# Patient Record
Sex: Male | Born: 1980 | Hispanic: No | Smoking: Never smoker
Health system: Southern US, Community
[De-identification: ages and names within clinical notes are randomized; demographics above are authoritative.]

## PROBLEM LIST (undated history)

## (undated) DIAGNOSIS — E7800 Pure hypercholesterolemia, unspecified: Secondary | ICD-10-CM

## (undated) DIAGNOSIS — I10 Essential (primary) hypertension: Secondary | ICD-10-CM

---

## 2002-03-14 ENCOUNTER — Encounter: Payer: Self-pay | Admitting: Emergency Medicine

## 2002-03-14 ENCOUNTER — Emergency Department (HOSPITAL_COMMUNITY): Admission: EM | Admit: 2002-03-14 | Discharge: 2002-03-14 | Payer: Self-pay | Admitting: Emergency Medicine

## 2002-03-16 ENCOUNTER — Ambulatory Visit (HOSPITAL_COMMUNITY): Admission: RE | Admit: 2002-03-16 | Discharge: 2002-03-16 | Payer: Self-pay | Admitting: Otolaryngology

## 2002-03-16 ENCOUNTER — Encounter: Payer: Self-pay | Admitting: Otolaryngology

## 2004-10-10 ENCOUNTER — Emergency Department (HOSPITAL_COMMUNITY): Admission: EM | Admit: 2004-10-10 | Discharge: 2004-10-10 | Payer: Self-pay | Admitting: Family Medicine

## 2012-05-05 ENCOUNTER — Emergency Department (INDEPENDENT_AMBULATORY_CARE_PROVIDER_SITE_OTHER)
Admission: EM | Admit: 2012-05-05 | Discharge: 2012-05-05 | Disposition: A | Discharge status: Home / Self Care | Payer: Self-pay | Source: Home / Self Care

## 2012-05-05 ENCOUNTER — Encounter (HOSPITAL_COMMUNITY): Payer: Self-pay | Admitting: Emergency Medicine

## 2012-05-05 DIAGNOSIS — K625 Hemorrhage of anus and rectum: Secondary | ICD-10-CM

## 2012-05-05 DIAGNOSIS — K648 Other hemorrhoids: Secondary | ICD-10-CM

## 2012-05-05 LAB — OCCULT BLOOD, POC DEVICE: Fecal Occult Bld: NEGATIVE

## 2012-05-05 MED ORDER — HYDROCORTISONE ACETATE 25 MG RE SUPP: 25.0000 mg | Freq: Two times a day (BID) | RECTAL | Status: DC

## 2012-05-05 NOTE — ED Provider Notes (Signed)
History     CSN: [id number]  Arrival date & time 05/05/12  1023   None     Chief Complaint  Patient presents with  . Hemorrhoids    (Consider location/radiation/quality/duration/timing/severity/associated sxs/prior treatment) HPI Comments: Hispanic male presents with rectal bleeding. He said bleeding, associated with having stools for 5 months. In the past 3, months. He has felt a lump in his anus. He denies any pain. The blood is scanty and primarily seen on wiping. There has been no dripping of blood or bleeding between bowel movements. Denies systemic symptoms such as unusual weakness, fever, chills, or malaise.   History reviewed. No pertinent past medical history.  History reviewed. No pertinent past surgical history.  No family history on file.  History  Substance Use Topics  . Smoking status: Not on file  . Smokeless tobacco: Not on file  . Alcohol Use: Yes      Review of Systems  Constitutional: Negative.  Negative for fever and chills.  Respiratory: Negative.   Gastrointestinal: Positive for blood in stool and anal bleeding. Negative for nausea, vomiting, abdominal pain, diarrhea, constipation and rectal pain.  Genitourinary: Negative.   Skin: Negative.   Neurological: Negative.     Allergies  Review of patient's allergies indicates no known allergies.  Home Medications   Current Outpatient Rx  Name Route Sig Dispense Refill  . HYDROCORTISONE ACETATE 25 MG RE SUPP Rectal Place 1 suppository (25 mg total) rectally 2 (two) times daily. 28 suppository 0    BP 120/77  Pulse 71  Temp 98.2 F (36.8 C) (Oral)  Resp 16  SpO2 100%  Physical Exam  Constitutional: He is oriented to person, place, and time. He appears well-developed and well-nourished. No distress.  Eyes: EOM are normal.  Neck: Normal range of motion. Neck supple.  Pulmonary/Chest: Effort normal. No respiratory distress.  Abdominal: Soft. There is no tenderness.  Genitourinary: Guaiac  negative stool.       Rectal exam: There are several noninflamed hemorrhoidal tags external to the rectum. On digital palpation. There is tenderness in the mucosa just within the rectal sphincter. No specific nodule for mass is palpated. No overt blood was seen in Hemoccult is negative  Musculoskeletal: He exhibits no edema and no tenderness.  Neurological: He is alert and oriented to person, place, and time. No cranial nerve deficit.  Skin: Skin is warm and dry.  Psychiatric: He has a normal mood and affect.    ED Course  Procedures (including critical care time)  Labs Reviewed - No data to display No results found.   1. Rectal bleeding   2. Hemorrhoids, internal, with bleeding       MDM  Although there is tenderness just beyond the anal sphincter, there are no other objective findings today. No palpable nodules or other masses and no overt or occult bleeding. Treat with Anusol-HC suppositories for 7-10 days. If the bleeding continues he should followup with the lower GI physician. Or if unable to obtain an appointment. May return. 4 increase amount of bleeding or the development of pain He should go to the emergency department         Hayden Rasmussen, NP 05/05/12 1245

## 2012-05-05 NOTE — ED Notes (Signed)
Pt c/o poss hemorrhoids x2 months... 5 months ago noticed blood in stool and on toilet paper when he wiped... 2 months ago noticed a lump near anus... Sx include: blood in stool... Denies: pain, fevers, vomiting, diarrhea, nausea, mucous in stools.. Pt is alert w/no signs of distress... Also c/o lower back pain.

## 2012-05-05 NOTE — ED Provider Notes (Signed)
Medical screening examination/treatment/procedure(s) were performed by non-physician practitioner and as supervising physician I was immediately available for consultation/collaboration.  Raynald Blend, MD 05/05/12 [phone]

## 2013-01-15 ENCOUNTER — Encounter (HOSPITAL_COMMUNITY): Payer: Self-pay | Admitting: Emergency Medicine

## 2013-01-15 ENCOUNTER — Emergency Department (INDEPENDENT_AMBULATORY_CARE_PROVIDER_SITE_OTHER)
Admission: EM | Admit: 2013-01-15 | Discharge: 2013-01-15 | Disposition: A | Discharge status: Home / Self Care | Payer: Self-pay | Source: Home / Self Care

## 2013-01-15 DIAGNOSIS — H15002 Unspecified scleritis, left eye: Secondary | ICD-10-CM

## 2013-01-15 DIAGNOSIS — H15009 Unspecified scleritis, unspecified eye: Secondary | ICD-10-CM

## 2013-01-15 LAB — URINALYSIS, ROUTINE W REFLEX MICROSCOPIC
Glucose, UA: NEGATIVE mg/dL
Leukocytes, UA: NEGATIVE
Specific Gravity, Urine: 1.02 (ref 1.005–1.030)
pH: 5.5 (ref 5.0–8.0)

## 2013-01-15 LAB — BASIC METABOLIC PANEL
CO2: 25 mEq/L (ref 19–32)
Calcium: 9.5 mg/dL (ref 8.4–10.5)
Chloride: 100 mEq/L (ref 96–112)
Potassium: 3.5 mEq/L (ref 3.5–5.1)
Sodium: 136 mEq/L (ref 135–145)

## 2013-01-15 LAB — CBC
HCT: 42.6 % (ref 39.0–52.0)
Hemoglobin: 14.9 g/dL (ref 13.0–17.0)
WBC: 6.8 10*3/uL (ref 4.0–10.5)

## 2013-01-15 MED ORDER — ARTIFICIAL TEARS OP OINT: TOPICAL_OINTMENT | OPHTHALMIC | Status: DC | PRN

## 2013-01-15 MED ORDER — TETRACAINE HCL 0.5 % OP SOLN
OPHTHALMIC | Status: AC
  Filled 2013-01-15: qty 2

## 2013-01-15 MED ORDER — INDOMETHACIN 25 MG PO CAPS: 25.0000 mg | ORAL_CAPSULE | Freq: Three times a day (TID) | ORAL | Status: DC

## 2013-01-15 NOTE — ED Provider Notes (Signed)
Brandon Irwin is a 32 y.o. male who presents to Urgent Care today for left eye pain. Patient developed left eye redness on Wednesday with no injury or incident. He has pain and redness on the medial aspect of his eye and pain with lateral eye deviation. He denies any fevers or chills cough change in his urine. Additionally he notes a mild left-sided headache. He denies any blurry vision with eye motion. He denies any photophobia. He has never had anything like this before and feels well otherwise. He does not have health insurance nor access to an ophthalmologist. No night sweats. No exposure to welding or metal fragment.    PMH reviewed.  Healthy otherwise  History  Substance Use Topics  . Smoking status: Not on file  . Smokeless tobacco: Not on file  . Alcohol Use: Yes   works as a Administrator ROS as above Medications reviewed. No current facility-administered medications for this encounter.   Current Outpatient Prescriptions  Medication Sig Dispense Refill  . artificial tears (LACRILUBE) OINT ophthalmic ointment Apply to eye every 4 (four) hours as needed.  3.5 g  1  . hydrocortisone (ANUSOL-HC) 25 MG suppository Place 1 suppository (25 mg total) rectally 2 (two) times daily.  28 suppository  0  . indomethacin (INDOCIN) 25 MG capsule Take 1 capsule (25 mg total) by mouth 3 (three) times daily with meals.  75 capsule  0    Exam:  BP 117/69  Pulse 63  Temp(Src) 98.7 F (37.1 C) (Oral)  SpO2 99% Gen: Well NAD HEENT: EOMI,  MMM, nodular conjunctival injection on the medial aspect of the sclera extending to the but not through or over the cornea.  Uptake of fluorescein in the area of nodular conjunctival injection.  Normal funduscopic exam bilaterally.  Pupils are equal round and reactive to light.  Visual acuity is 20/30 bilaterally.   Lungs: CTABL Nl WOB Heart: RRR no MRG Abd: NABS, NT, ND Exts: Non edematous BL  LE, warm and well perfused.   No results found for this or any  previous visit (from the past 24 hour(s)). No results found.  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  Additional possibilities include irritant/or abrasion.  Plan: Ideally this is managed with ophthalmologist over patient is unlikely to be able to be seen. Provided information for Dr. Jilda Roche office.  Plan to treat with oral indomethacin 75 mg daily.  Additionally use topical Lacri-Lube.  Discussed in detail precautions. We'll followup in the emergency room if not improving or worsening.  Additionally will do workup for systemic illness with CBC, BMP, ESR, urinalysis with reflex microscopy.  Will call patient if labs are positive.  Patient expresses understanding and agreement. Please see discharge summary.      Rodolph Bong, MD 01/15/13 [phone]

## 2013-01-15 NOTE — ED Provider Notes (Signed)
Medical screening examination/treatment/procedure(s) were performed by resident physician or non-physician practitioner and as supervising physician I was immediately available for consultation/collaboration.   Jennelle Pinkstaff DOUGLAS MD.   Luismanuel Corman D Genesee Nase, MD 01/15/13 1344 

## 2013-01-15 NOTE — ED Notes (Signed)
Complaining of left eye pain and redness which started last Wednesday.  Patient has used eye drops Visine but no relief.  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  Does have blurry vision as he tries to see from the left side.

## 2013-03-07 ENCOUNTER — Encounter (HOSPITAL_COMMUNITY): Payer: Self-pay | Admitting: Emergency Medicine

## 2013-03-07 ENCOUNTER — Emergency Department (INDEPENDENT_AMBULATORY_CARE_PROVIDER_SITE_OTHER)
Admission: EM | Admit: 2013-03-07 | Discharge: 2013-03-07 | Disposition: A | Discharge status: Home / Self Care | Payer: Self-pay | Source: Home / Self Care | Attending: Family Medicine | Admitting: Family Medicine

## 2013-03-07 DIAGNOSIS — T63391A Toxic effect of venom of other spider, accidental (unintentional), initial encounter: Secondary | ICD-10-CM

## 2013-03-07 DIAGNOSIS — T6391XA Toxic effect of contact with unspecified venomous animal, accidental (unintentional), initial encounter: Secondary | ICD-10-CM

## 2013-03-07 DIAGNOSIS — Z23 Encounter for immunization: Secondary | ICD-10-CM

## 2013-03-07 MED ORDER — LORAZEPAM 0.5 MG PO TABS: ORAL_TABLET | ORAL | Status: DC

## 2013-03-07 MED ORDER — IBUPROFEN 800 MG PO TABS: ORAL_TABLET | ORAL | Status: DC

## 2013-03-07 MED ORDER — HYDROCODONE-ACETAMINOPHEN 5-325 MG PO TABS: ORAL_TABLET | ORAL | Status: DC

## 2013-03-07 MED ORDER — TETANUS-DIPHTH-ACELL PERTUSSIS 5-2.5-18.5 LF-MCG/0.5 IM SUSP
0.5000 mL | Freq: Once | INTRAMUSCULAR | Status: AC
  Administered 2013-03-07: 0.5 mL via INTRAMUSCULAR

## 2013-03-07 MED ORDER — TETANUS-DIPHTH-ACELL PERTUSSIS 5-2.5-18.5 LF-MCG/0.5 IM SUSP
INTRAMUSCULAR | Status: AC
  Filled 2013-03-07: qty 0.5

## 2013-03-07 NOTE — ED Provider Notes (Signed)
CSN: [id number]     Arrival date & time 03/07/13  4098 History   First MD Initiated Contact with Patient 03/07/13 0831     Chief Complaint  Patient presents with  . Insect Bite   (Consider location/radiation/quality/duration/timing/severity/associated sxs/prior Treatment) HPI Comments: 32 year old man otherwise previously healthy. Here complaining of right hand pain with radiation towards the right forearm and arm also reporting abdominal wall pain on right side and headache. Patient reports symptoms started after he was bitten by a spider while removing a motor piece from a cluttered garage. Patient was not able to recover the spider but states that that black widow spiders have been recovered from the same place recently and described the spider as black with a red dot in lower body. Denies chest pain, palpitations, sweating or difficulty breathing. No nausea or vomiting. No blurred vision. Reports mostly muscular pain is bothering him currently.   History reviewed. No pertinent past medical history. History reviewed. No pertinent past surgical history. History reviewed. No pertinent family history. History  Substance Use Topics  . Smoking status: Not on file  . Smokeless tobacco: Not on file  . Alcohol Use: Yes    Review of Systems  Constitutional: Negative for fever, chills, diaphoresis, appetite change and fatigue.  HENT: Negative for congestion, sore throat and trouble swallowing.   Eyes: Negative for visual disturbance.  Respiratory: Negative for cough, shortness of breath and wheezing.   Gastrointestinal: Positive for abdominal pain. Negative for nausea, vomiting and diarrhea.  Musculoskeletal: Positive for myalgias. Negative for arthralgias.  Skin: Positive for rash and wound.  Neurological: Positive for headaches. Negative for dizziness.  All other systems reviewed and are negative.    Allergies  Review of patient's allergies indicates no known allergies.  Home  Medications   Current Outpatient Rx  Name  Route  Sig  Dispense  Refill  . artificial tears (LACRILUBE) OINT ophthalmic ointment   Ophthalmic   Apply to eye every 4 (four) hours as needed.   3.5 g   1   . HYDROcodone-acetaminophen (NORCO/VICODIN) 5-325 MG per tablet      1-2 tabs po every 6 hours as needed for pain.   20 tablet   0   . ibuprofen (ADVIL,MOTRIN) 800 MG tablet      Take with food   21 tablet   0   . LORazepam (ATIVAN) 0.5 MG tablet      2 tabs by mouth every 8 hours as needed for muscle spasm.   12 tablet   0    BP 131/83  Pulse 66  Temp(Src) 97.3 F (36.3 C) (Oral)  Resp 18  SpO2 100% Physical Exam  Nursing note and vitals reviewed. Constitutional: He is oriented to person, place, and time. He appears well-developed and well-nourished. No distress.  HENT:  Head: Normocephalic and atraumatic.  Mouth/Throat: Oropharynx is clear and moist. No oropharyngeal exudate.  Eyes: Conjunctivae and EOM are normal. Pupils are equal, round, and reactive to light. No scleral icterus.  Neck: Neck supple.  Cardiovascular: Normal rate, regular rhythm, normal heart sounds and intact distal pulses.   No murmur heard. Pulmonary/Chest: Effort normal and breath sounds normal. No respiratory distress. He has no wheezes. He has no rales. He exhibits no tenderness.  Abdominal: Soft. Bowel sounds are normal. He exhibits no distension and no mass. There is no tenderness. There is no rebound and no guarding.  Musculoskeletal:  There is reported muscle tenderness in dorsolateral forearm radiating from 4th digit. No  skin changes in color or altered range of motion.  Lymphadenopathy:    He has no cervical adenopathy.  Neurological: He is alert and oriented to person, place, and time.  Skin: He is not diaphoretic.  There is swelling and erythema of the right 4th digit proximal phalange.  No obvious puncture wound. There is a small wheel which resembles a possible insect bite. No  stricking erythema.    ED Course  Procedures (including critical care time) Labs Review Labs Reviewed - No data to display Imaging Review No results found.  MDM   1. 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 No severe systemic symptoms currently. Mild local reaction. Tetatanus buster administered. Prescribed ibuprofen 800 mg, hydrocodone/acetaminophen 325 mg or 5 mg #20 tablets and Ativan 0.5 mg 12 tablets. Supportive care and red flags that should prompt his return to medical attention discussed with patient in Spanish and provided in writing.     Sharin Grave, MD 03/08/13 [phone]

## 2013-03-07 NOTE — ED Notes (Signed)
C/o insect bite this morning.   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  Patient says he has pain going up his right arm.

## 2013-12-31 ENCOUNTER — Emergency Department (INDEPENDENT_AMBULATORY_CARE_PROVIDER_SITE_OTHER)
Admission: EM | Admit: 2013-12-31 | Discharge: 2013-12-31 | Disposition: A | Discharge status: Home / Self Care | Payer: Self-pay | Source: Home / Self Care | Attending: Family Medicine | Admitting: Family Medicine

## 2013-12-31 ENCOUNTER — Encounter (HOSPITAL_COMMUNITY): Payer: Self-pay | Admitting: Emergency Medicine

## 2013-12-31 DIAGNOSIS — K2921 Alcoholic gastritis with bleeding: Secondary | ICD-10-CM

## 2013-12-31 MED ORDER — GI COCKTAIL ~~LOC~~
ORAL | Status: AC
  Filled 2013-12-31: qty 30

## 2013-12-31 MED ORDER — ONDANSETRON HCL 4 MG PO TABS: 4.0000 mg | ORAL_TABLET | Freq: Four times a day (QID) | ORAL | Status: DC

## 2013-12-31 MED ORDER — ONDANSETRON 4 MG PO TBDP
4.0000 mg | ORAL_TABLET | Freq: Once | ORAL | Status: AC
  Administered 2013-12-31: 4 mg via ORAL

## 2013-12-31 MED ORDER — GI COCKTAIL ~~LOC~~
30.0000 mL | Freq: Once | ORAL | Status: AC
  Administered 2013-12-31: 30 mL via ORAL

## 2013-12-31 MED ORDER — ONDANSETRON 4 MG PO TBDP
ORAL_TABLET | ORAL | Status: AC
  Filled 2013-12-31: qty 1

## 2013-12-31 NOTE — ED Provider Notes (Signed)
CSN: [id number]     Arrival date & time 12/31/13  1352 History   First MD Initiated Contact with Patient 12/31/13 1551     Chief Complaint  Patient presents with  . Emesis   (Consider location/radiation/quality/duration/timing/severity/associated sxs/prior Treatment) Patient is a 33 y.o. male presenting with vomiting. The history is provided by the patient.  Emesis Severity:  Mild Duration:  8 hours Quality:  Bright red blood Able to tolerate:  Liquids Progression:  Resolved Chronicity:  New (xs beer ingestion over w/e at family party, , this am had n/v x 4 with streaks of blood seen x 2) Relieved by:  Nothing Worsened by:  Nothing tried Ineffective treatments:  None tried Associated symptoms: no abdominal pain and no diarrhea   Risk factors: alcohol use     History reviewed. No pertinent past medical history. History reviewed. No pertinent past surgical history. History reviewed. No pertinent family history. History  Substance Use Topics  . Smoking status: Unknown If Ever Smoked  . Smokeless tobacco: Not on file  . Alcohol Use: Yes    Review of Systems  Constitutional: Negative.   Cardiovascular: Negative.   Gastrointestinal: Positive for vomiting. Negative for nausea, abdominal pain, diarrhea and blood in stool.    Allergies  Review of patient's allergies indicates no known allergies.  Home Medications   Prior to Admission medications   Medication Sig Start Date End Date Taking? Authorizing Provider  artificial tears (LACRILUBE) OINT ophthalmic ointment Apply to eye every 4 (four) hours as needed. 01/15/13   Rodolph BongEvan S Corey, MD  HYDROcodone-acetaminophen (NORCO/VICODIN) 5-325 MG per tablet 1-2 tabs po every 6 hours as needed for pain. 03/07/13   Adlih Moreno-Coll, MD  ibuprofen (ADVIL,MOTRIN) 800 MG tablet Take with food 03/07/13   Adlih Moreno-Coll, MD  LORazepam (ATIVAN) 0.5 MG tablet 2 tabs by mouth every 8 hours as needed for muscle spasm. 03/07/13   Adlih Moreno-Coll, MD   ondansetron (ZOFRAN) 4 MG tablet Take 1 tablet (4 mg total) by mouth every 6 (six) hours. Prn n/v. 12/31/13   Linna HoffJames D Kindl, MD   BP 133/87  Pulse 66  Temp(Src) 99.2 F (37.3 C) (Oral)  Resp 14  SpO2 100% Physical Exam  Nursing note and vitals reviewed. Constitutional: He is oriented to person, place, and time. He appears well-developed and well-nourished. No distress.  HENT:  Mouth/Throat: Oropharynx is clear and moist.  Cardiovascular: Normal heart sounds.   Pulmonary/Chest: Breath sounds normal.  Abdominal: Soft. Bowel sounds are normal. He exhibits no distension and no mass. There is no tenderness. There is no rebound and no guarding.  Neurological: He is alert and oriented to person, place, and time.  Skin: Skin is warm and dry.    ED Course  Procedures (including critical care time) Labs Review Labs Reviewed - No data to display  Imaging Review No results found.   MDM   1. Alcoholic gastritis with bleeding        Linna HoffJames D Kindl, MD 12/31/13 1626

## 2013-12-31 NOTE — ED Notes (Signed)
Reportedly over indulged this weekend

## 2013-12-31 NOTE — Discharge Instructions (Signed)
Use mylanta at bedtime for 2 weeks, no more beer for 2 weeks , return to ER if further bleeding.

## 2014-04-30 ENCOUNTER — Emergency Department (HOSPITAL_COMMUNITY)
Admission: EM | Admit: 2014-04-30 | Discharge: 2014-04-30 | Disposition: A | Discharge status: Home / Self Care | Payer: Self-pay | Attending: Emergency Medicine | Admitting: Emergency Medicine

## 2014-04-30 ENCOUNTER — Encounter (HOSPITAL_COMMUNITY): Payer: Self-pay | Admitting: Emergency Medicine

## 2014-04-30 DIAGNOSIS — B356 Tinea cruris: Secondary | ICD-10-CM | POA: Insufficient documentation

## 2014-04-30 MED ORDER — CLOTRIMAZOLE 1 % EX CREA: 1.0000 "application " | TOPICAL_CREAM | Freq: Two times a day (BID) | CUTANEOUS | Status: DC

## 2014-04-30 NOTE — ED Provider Notes (Signed)
CSN: [id number]     Arrival date & time 04/30/14  1156 History  This chart was scribed for non-physician practitioner, Trixie DredgeEmily Alexzandra Bilton, PA-C working with Flint MelterElliott L Wentz, MD by Greggory StallionKayla Andersen, ED scribe. This patient was seen in room TR05C/TR05C and the patient's care was started at 12:36 PM.    Chief Complaint  Patient presents with  . Rash   The history is provided by the patient. No language interpreter was used.   HPI Comments: Brandon Irwin is a 33 y.o. male who presents to the Emergency Department complaining of an itchy rash to his groin area that started 2 weeks ago. The itching is only on the left side, in one specific area.  Denies any pain with the rash. He also has changes to his toenails.  States he has not used any OTC medications for the rash. Pt uses condoms and states there is no concern for STD. Denies fever, abdominal pain, dysuria, testicular pain or swelling, weakness or numbness in feet.  Denies genital lesions.   History reviewed. No pertinent past medical history. History reviewed. No pertinent past surgical history. History reviewed. No pertinent family history. History  Substance Use Topics  . Smoking status: Unknown If Ever Smoked  . Smokeless tobacco: Not on file  . Alcohol Use: Yes    Review of Systems  Constitutional: Negative for fever.  Gastrointestinal: Negative for abdominal pain.  Genitourinary: Negative for dysuria, scrotal swelling and testicular pain.  Skin: Positive for rash.  Neurological: Negative for weakness and numbness.  All other systems reviewed and are negative.  Allergies  Review of patient's allergies indicates no known allergies.  Home Medications   Prior to Admission medications   Medication Sig Start Date End Date Taking? Authorizing Provider  artificial tears (LACRILUBE) OINT ophthalmic ointment Apply to eye every 4 (four) hours as needed. 01/15/13   Rodolph BongEvan S Corey, MD  HYDROcodone-acetaminophen (NORCO/VICODIN) 5-325 MG per tablet  1-2 tabs po every 6 hours as needed for pain. 03/07/13   Adlih Moreno-Coll, MD  ibuprofen (ADVIL,MOTRIN) 800 MG tablet Take with food 03/07/13   Adlih Moreno-Coll, MD  LORazepam (ATIVAN) 0.5 MG tablet 2 tabs by mouth every 8 hours as needed for muscle spasm. 03/07/13   Adlih Moreno-Coll, MD  ondansetron (ZOFRAN) 4 MG tablet Take 1 tablet (4 mg total) by mouth every 6 (six) hours. Prn n/v. 12/31/13   Linna HoffJames D Kindl, MD   BP 127/80  Pulse 63  Temp(Src) 98.1 F (36.7 C) (Oral)  Resp 18  SpO2 100%  Physical Exam  Nursing note and vitals reviewed. Constitutional: He appears well-developed and well-nourished. No distress.  HENT:  Head: Normocephalic and atraumatic.  Neck: Neck supple.  Pulmonary/Chest: Effort normal.  Abdominal: Soft. He exhibits no distension. There is no tenderness. There is no rebound and no guarding.  Genitourinary:  White, scaly rash on the left groin in area of heavy pubic hair. No burrows. No lesions with exception of small opening of the skin, likely from scratching.  No erythema, edema, warmth, discharge, or tenderness . No lymphadenopathy. Testicles normal. No discharge. Penis otherwise normal. Not circumcised.   Neurological: He is alert.  Skin: He is not diaphoretic.  Thickening and cracking of nails.     ED Course  Procedures (including critical care time)  DIAGNOSTIC STUDIES: Oxygen Saturation is 100% on RA, normal by my interpretation.    COORDINATION OF CARE: 12:39 PM-Discussed treatment plan which includes tinea treatment with pt at bedside and pt agreed to  plan.   Labs Review Labs Reviewed - No data to display  Imaging Review No results found.   EKG Interpretation None      MDM   Final diagnoses:  Tinea cruris   Afebrile, nontoxic patient with left groin pruritic rash.  Visible rash is very small area, scaly white, likely tinea.  No burrows or insects visualized.  Small lesion is likely from scratching.  No e/o superinfection.  Pt declines STD  testing.   D/C home with clotrimazole, PCP follow up.  Discussed result, findings, treatment, and follow up  with patient.  Pt given return precautions.  Pt verbalizes understanding and agrees with plan.       I personally performed the services described in this documentation, which was scribed in my presence. The recorded information has been reviewed and is accurate.  Trixie Dredgemily Aline Wesche, PA-C 04/30/14 1328

## 2014-04-30 NOTE — ED Notes (Signed)
Pt reports rash and itching to groin area for days. No acute distress noted at triage.

## 2014-04-30 NOTE — ED Notes (Signed)
Pt states he is afraid that he has caused a fungal infection on shaft/head of penis due to washing his feet that has fungal infection in toenail and then washing his genitalia.

## 2014-04-30 NOTE — Discharge Instructions (Signed)
Read the information below.  Use the prescribed medication as directed.  Please discuss all new medications with your pharmacist.  You may return to the Emergency Department at any time for worsening condition or any new symptoms that concern you.  If you develop redness, swelling, pus draining from the wound, or fevers greater than 100.4, return to the ER immediately for a recheck.    Jock Itch Jock itch is a germ infection of the groin and upper thighs. The type of germ that causes jock itch is a fungus. It is itchy and often feels like it is burning. It is common in people who play sports. Sweating and wearing certain athletic gear can cause this type of rash. HOME CARE  Take your medicines as told. Finish them even if you start to feel better.  Wear loose-fitting clothing.  Men should wear cotton boxer shorts.  Women should wear cotton underwear.  Avoid hot baths.  Dry the groin area well after bathing. GET HELP RIGHT AWAY IF:   Your rash is worse or spreading.  Your rash returns after treatment is finished.  Your rash is not gone in 4 weeks.  The area becomes red, warm, tender, and puffy (swollen).  You have a fever. MAKE SURE YOU:  Understand these instructions.  Will watch your condition.  Will get help right away if you are not doing well or get worse. Document Released: 09/15/2009 Document Revised: 09/13/2011 Document Reviewed: 09/15/2009 [facility] Patient Information 2015 [employer] [place]. This information is not intended to replace advice given to you by your health care provider. Make sure you discuss any questions you have with your health care provider.

## 2014-05-01 NOTE — ED Provider Notes (Signed)
Medical screening examination/treatment/procedure(s) were performed by non-physician practitioner and as supervising physician I was immediately available for consultation/collaboration.   EKG Interpretation None       Rafeef Lau L Marvelous Bouwens, MD 05/01/14 1110 

## 2014-09-20 ENCOUNTER — Ambulatory Visit: Payer: Self-pay

## 2015-04-01 ENCOUNTER — Ambulatory Visit (INDEPENDENT_AMBULATORY_CARE_PROVIDER_SITE_OTHER): Payer: Self-pay | Admitting: Family Medicine

## 2015-04-01 VITALS — BP 110/74 | HR 62 | Temp 97.9°F | Resp 18 | Ht 64.5 in | Wt 157.6 lb

## 2015-04-01 DIAGNOSIS — K625 Hemorrhage of anus and rectum: Secondary | ICD-10-CM

## 2015-04-01 DIAGNOSIS — H1189 Other specified disorders of conjunctiva: Secondary | ICD-10-CM

## 2015-04-01 DIAGNOSIS — K649 Unspecified hemorrhoids: Secondary | ICD-10-CM

## 2015-04-01 DIAGNOSIS — K644 Residual hemorrhoidal skin tags: Secondary | ICD-10-CM

## 2015-04-01 LAB — POCT CBC
Granulocyte percent: 49.2 %G (ref 37–80)
HEMATOCRIT: 43.3 % — AB (ref 43.5–53.7)
HEMOGLOBIN: 14.1 g/dL (ref 14.1–18.1)
LYMPH, POC: 2.9 (ref 0.6–3.4)
MCH, POC: 27.3 pg (ref 27–31.2)
MCHC: 32.4 g/dL (ref 31.8–35.4)
MCV: 84.2 fL (ref 80–97)
MID (cbc): 0.8 (ref 0–0.9)
MPV: 7.1 fL (ref 0–99.8)
PLATELET COUNT, POC: 368 10*3/uL (ref 142–424)
POC GRANULOCYTE: 3.6 (ref 2–6.9)
POC LYMPH %: 39.6 % (ref 10–50)
POC MID %: 11.2 %M (ref 0–12)
RBC: 5.14 M/uL (ref 4.69–6.13)
RDW, POC: 13.4 %
WBC: 7.3 10*3/uL (ref 4.6–10.2)

## 2015-04-01 MED ORDER — HYDROCORTISONE ACE-PRAMOXINE 2.5-1 % RE CREA: 1.0000 "application " | TOPICAL_CREAM | Freq: Three times a day (TID) | RECTAL | Status: DC

## 2015-04-01 MED ORDER — AZELASTINE HCL 0.05 % OP SOLN: 1.0000 [drp] | Freq: Two times a day (BID) | OPHTHALMIC | Status: DC

## 2015-04-01 NOTE — Progress Notes (Signed)
Patient ID: Brandon Irwin, male    DOB: Jul 07, 1980  Age: 34 y.o. MRN: [id number]  Chief Complaint  Patient presents with  . Blood In Stools    x 2 weeks  . Hemorrhoids    just noticed few days ago  . Eye Pain    Left Eye    Subjective:   34 year old Hispanic male who is here with rectal bleeding for the last 2 weeks when he wipes primarily. He has noticed that he has some hemorrhoidal tags which concerns him. He feels them back there. He had his wife check him. The last 3 days he's also had irritation and pain in the left eye. 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 Knows of no specific foreign body or injury. He wondered whether he could have an infection there.  He denies abdominal pain. No nausea or vomiting or diarrhea. Does not strain heart is stools.  Current allergies, medications, problem list, past/family and social histories reviewed.  Objective:  BP 110/74 mmHg  Pulse 62  Temp(Src) 97.9 F (36.6 C) (Oral)  Resp 18  Ht 5' 4.5" (1.638 m)  Wt 157 lb 9.6 oz (71.487 kg)  BMI 26.64 kg/m2  SpO2 98%  Pleasant alert gentleman in no acute distress. Speaks fairly good [place]. His TMs are normal. Eyes only minimally injected on the left compared to the right. Visual acuity was good. Eyes are PERRLA. Fundi appear benign. No foreign body could be seen in the left eye. There is only minimal injection of the left eye, which he has rubbed some. His fluorescein stain testing was normal with no abnormal uptake.  Abdomen soft without masses or tenderness. Anus appears normal except for 2 hemorrhoidal tags. No bleeding could be seen at this time. On digital exam I can feel a little internal hemorrhoid. No other masses.  Assessment & Plan:   Assessment: 1. Rectal bleeding   2. Hemorrhoids, unspecified hemorrhoid type   3. Hemorrhoidal skin tags   4. Conjunctival irritation       Plan: Treat symptomatically. If he keeps having rectal bleeding  problems will send him for a colonoscopy  Orders Placed This Encounter  Procedures  . POCT CBC    Meds ordered this encounter  Medications  . azelastine (OPTIVAR) 0.05 % ophthalmic solution    Sig: Place 1 drop into the left eye 2 (two) times daily.    Dispense:  6 mL    Refill:  0  . hydrocortisone-pramoxine (ANALPRAM HC) 2.5-1 % rectal cream    Sig: Place 1 application rectally 3 (three) times daily.    Dispense:  30 g    Refill:  1     I explained to the patient that he would just need to live with the hemorrhoidal tags. He would like to see a specialist, so I will make a referral to a gastroenterologist for evaluation of the anal bleeding. The patient is fearful of malignancy or other problems and I reassured him that this was not of that kind of concerned.    Patient Instructions  Use the azelastine ophthalmic eyedrops 1 drop in your left eye (both eyes if needed) twice daily as needed for irritation  Return if the eye gets worse  Use Analpram hemorrhoidal cream 2-3 times daily as directed until the bleeding has stopped.  The hemorrhoid tags persist usually. It is best just to live with these. You may get more as you get older. Avoid lifting  and straining.  If you're stools are too hard take a stool softener such as Colace. It is best to try and avoid having to strain in your stools.  Return for further evaluation if you keep seeing bleeding over the next 2 weeks.  Some time return for a complete physical examination     No Follow-up on file.   HOPPER,DAVID, MD 04/01/2015

## 2015-04-01 NOTE — Patient Instructions (Signed)
Use the azelastine ophthalmic eyedrops 1 drop in your left eye (both eyes if needed) twice daily as needed for irritation  Return if the eye gets worse  Use Analpram hemorrhoidal cream 2-3 times daily as directed until the bleeding has stopped.  The hemorrhoid tags persist usually. It is best just to live with these. You may get more as you get older. Avoid lifting and straining.  If you're stools are too hard take a stool softener such as Colace. It is best to try and avoid having to strain in your stools.  Return for further evaluation if you keep seeing bleeding over the next 2 weeks.  Some time return for a complete physical examination

## 2015-04-24 ENCOUNTER — Emergency Department (INDEPENDENT_AMBULATORY_CARE_PROVIDER_SITE_OTHER)
Admission: EM | Admit: 2015-04-24 | Discharge: 2015-04-24 | Disposition: A | Discharge status: Home / Self Care | Payer: Self-pay | Source: Home / Self Care | Attending: Family Medicine | Admitting: Family Medicine

## 2015-04-24 ENCOUNTER — Encounter (HOSPITAL_COMMUNITY): Payer: Self-pay | Admitting: Family Medicine

## 2015-04-24 DIAGNOSIS — H6091 Unspecified otitis externa, right ear: Secondary | ICD-10-CM

## 2015-04-24 MED ORDER — NEOMYCIN-POLYMYXIN-HC 3.5-10000-1 OT SOLN: OTIC | Status: DC

## 2015-04-24 NOTE — ED Provider Notes (Signed)
CSN: [id number]     Arrival date & time 04/24/15  1937 History   First MD Initiated Contact with Patient 04/24/15 1949     No chief complaint on file.  (Consider location/radiation/quality/duration/timing/severity/associated sxs/prior Treatment) HPI  Right ear pain. Started approximately 2-3 days ago. Getting worse. Constant. Has not tried anything for her symptoms. 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 Denies any discharge from the ear. Decreased hearing. Left ear unaffected.  No past medical history on file. No past surgical history on file. No family history on file. Social History  Substance Use Topics  . Smoking status: Never Smoker   . Smokeless tobacco: Not on file  . Alcohol Use: 0.0 oz/week    0 Standard drinks or equivalent per week    Review of Systems Per HPI with all other pertinent systems negative.   Allergies  Review of patient's allergies indicates no known allergies.  Home Medications   Prior to Admission medications   Medication Sig Start Date End Date Taking? Authorizing Provider  artificial tears (LACRILUBE) OINT ophthalmic ointment Apply to eye every 4 (four) hours as needed. Patient not taking: Reported on 04/01/2015 01/15/13   Rodolph BongEvan S Corey, MD  azelastine (OPTIVAR) 0.05 % ophthalmic solution Place 1 drop into the left eye 2 (two) times daily. 04/01/15   Peyton Najjaravid H Hopper, MD  clotrimazole (LOTRIMIN) 1 % cream Apply 1 application topically 2 (two) times daily. 04/30/14   Trixie DredgeEmily West, PA-C  hydrocortisone-pramoxine (ANALPRAM HC) 2.5-1 % rectal cream Place 1 application rectally 3 (three) times daily. 04/01/15   Peyton Najjaravid H Hopper, MD  neomycin-polymyxin-hydrocortisone (CORTISPORIN) otic solution 3-4 Drops, 4 times per day for 7 days 04/24/15   Ozella Rocksavid J Kailia Starry, MD   Meds Ordered and Administered this Visit  Medications - No data to display  BP 136/94 mmHg  Pulse 64   Temp(Src) 98 F (36.7 C) (Oral)  Resp 14  SpO2 100% No data found.   Physical Exam Physical Exam  Constitutional: oriented to person, place, and time. appears well-developed and well-nourished. No distress.  HENT:  Right external canal with significant edema and maceration of the skin. TM normal. Left TM normal. Left external canal without a any edema but some appreciable skin maceration starting approximately in the 3 to 5:00 position. Head: Normocephalic and atraumatic.  Eyes: EOMI. PERRL.  Neck: Normal range of motion.  Cardiovascular: RRR, no m/r/g, 2+ distal pulses,  Pulmonary/Chest: Effort normal and breath sounds normal. No respiratory distress.  Abdominal: Soft. Bowel sounds are normal. NonTTP, no distension.  Musculoskeletal: Normal range of motion. Non ttp, no effusion.  Neurological: alert and oriented to person, place, and time.  Skin: Skin is warm. No rash noted. non diaphoretic.  Psychiatric: normal mood and affect. behavior is normal. Judgment and thought content normal.   ED Course  Procedures (including critical care time)  Labs Review Labs Reviewed - No data to display  Imaging Review No results found.   Visual Acuity Review  Right Eye Distance:   Left Eye Distance:   Bilateral Distance:    Right Eye Near:   Left Eye Near:    Bilateral Near:         MDM   1. Otitis externa, right    Cortisporin, NSAIDs. Discussed ear hygiene.    Ozella Rocksavid J Erma Joubert, MD 04/24/15 2006

## 2015-04-24 NOTE — Discharge Instructions (Signed)
You have otitis externa.  This will need drops to cure Please use ibuprofen 600mg  every 6 hours  Usted tiene otitis externa. Esto necesitar gotas para curar Por favor, use ibuprofeno 600 mg cada 6 horas     Otitis externa  (Otitis Externa)  La otitis externa es una infeccin bacteriana en el odo externo. El odo externo es el rea desde el tmpano hasta el exterior de la [place]. Tambin se llama "odo de nadador". CUIDADOS EN EL HOGAR   Coloque las gotas en el odo segn la prescripcin mdica.  Slo debe tomar los [employer] Companymedicamentos como se los han recetado.  Si sufre diabetes, su mdico le puede dar ms indicaciones. Siga los consejos del mdico.  Cumpla con los controles mdicos segn le indiquen. Para evitar una nueva infeccin:   Mantenga el odo seco. Use la punta de una toalla para secar odo despus de nadar o de darse un bao.  Evite rascarse o poner objetos en el interior del odo.  Evite Progress Energynadar en los lagos, en agua sucia, o en las piscinas que colocan poca cantidad de un producto qumico llamado cloro.  Puede usar las gotas para los odos despus de Programmer, systemsnadar. Mezcle en cantidades iguales vinagre blanco y alcohol en una botella. Ponga 3 o 4 gotas en cada odo. SOLICITE AYUDA SI:   Tiene fiebre.  El odo est rojo, hinchado, le duele o sale pus despus de [address] das.  Contina supurando lquido de color blanco amarillento (pus) que proviene del odo despus de 3 das.  El dolor, la hinchazn o el enrojecimiento empeoran.  Siente un dolor de cabeza muy intenso.  Tiene enrojecimiento, hinchazn, dolor o sensibilidad detrs de Museum/gallery conservatorla oreja. ASEGRESE DE QUE:   Comprende estas instrucciones.  Controlar su enfermedad.  Solicitar ayuda de inmediato si no mejora o si empeora.   Esta informacin no tiene Theme park managercomo fin reemplazar el consejo del mdico. Asegrese de hacerle al mdico cualquier pregunta que tenga.   Document Released: 09/13/2011 Document Revised:  07/12/2014 Elsevier Interactive Patient Education [employer]2016 Elsevier Inc.

## 2015-04-27 ENCOUNTER — Encounter (HOSPITAL_COMMUNITY): Payer: Self-pay | Admitting: Emergency Medicine

## 2015-04-27 ENCOUNTER — Emergency Department (HOSPITAL_COMMUNITY)
Admission: EM | Admit: 2015-04-27 | Discharge: 2015-04-27 | Disposition: A | Discharge status: Home / Self Care | Payer: Self-pay | Attending: Emergency Medicine | Admitting: Emergency Medicine

## 2015-04-27 DIAGNOSIS — Z79899 Other long term (current) drug therapy: Secondary | ICD-10-CM | POA: Insufficient documentation

## 2015-04-27 DIAGNOSIS — H6500 Acute serous otitis media, unspecified ear: Secondary | ICD-10-CM

## 2015-04-27 DIAGNOSIS — H6091 Unspecified otitis externa, right ear: Secondary | ICD-10-CM | POA: Insufficient documentation

## 2015-04-27 DIAGNOSIS — H6501 Acute serous otitis media, right ear: Secondary | ICD-10-CM | POA: Insufficient documentation

## 2015-04-27 MED ORDER — ACETAMINOPHEN-CODEINE 120-12 MG/5ML PO SUSP: 5.0000 mL | Freq: Four times a day (QID) | ORAL | Status: DC | PRN

## 2015-04-27 MED ORDER — AMOXICILLIN 400 MG/5ML PO SUSR: 500.0000 mg | Freq: Three times a day (TID) | ORAL | Status: AC

## 2015-04-27 NOTE — ED Notes (Signed)
Declined W/C at D/C and was escorted to lobby by RN. 

## 2015-04-27 NOTE — ED Provider Notes (Signed)
CSN: [id number]     Arrival date & time 04/27/15  1048 History  By signing my name below, I, Placido SouLogan Joldersma, attest that this documentation has been prepared under the direction and in the presence of Teressa LowerVrinda Ahron Hulbert, NP. Electronically Signed: Placido SouLogan Joldersma, ED Scribe. 04/27/2015. 12:17 PM.   Chief Complaint  Patient presents with  . Otalgia   The history is provided by the patient. No language interpreter was used.    HPI Comments: Brandon Irwin is a 34 y.o. male who presents to the Emergency Department complaining of constant, moderate, right ear pain with onset 7 days ago. Pt was seen 3 days ago at [facility] and provided Cortisporin and NSAIDs which he says he has taken as prescribed and denies any relief of his symptoms. Pt rates his current pain as 10/10. He denies any other associated symptoms.   History reviewed. No pertinent past medical history. History reviewed. No pertinent past surgical history. Family History  Problem Relation Age of Onset  . Cancer Neg Hx   . Diabetes Neg Hx   . Heart failure Neg Hx    Social History  Substance Use Topics  . Smoking status: Never Smoker   . Smokeless tobacco: None  . Alcohol Use: 0.0 oz/week    0 Standard drinks or equivalent per week    Review of Systems  HENT: Positive for ear pain.   Neurological: Positive for headaches.  All other systems reviewed and are negative.  Allergies  Review of patient's allergies indicates no known allergies.  Home Medications   Prior to Admission medications   Medication Sig Start Date End Date Taking? Authorizing Provider  artificial tears (LACRILUBE) OINT ophthalmic ointment Apply to eye every 4 (four) hours as needed. Patient not taking: Reported on 04/01/2015 01/15/13   Rodolph BongEvan S Corey, MD  azelastine (OPTIVAR) 0.05 % ophthalmic solution Place 1 drop into the left eye 2 (two) times daily. 04/01/15   Peyton Najjaravid H Hopper, MD  clotrimazole (LOTRIMIN) 1 % cream Apply 1 application topically 2 (two) times  daily. 04/30/14   Trixie DredgeEmily West, PA-C  hydrocortisone-pramoxine (ANALPRAM HC) 2.5-1 % rectal cream Place 1 application rectally 3 (three) times daily. 04/01/15   Peyton Najjaravid H Hopper, MD  neomycin-polymyxin-hydrocortisone (CORTISPORIN) otic solution 3-4 Drops, 4 times per day for 7 days 04/24/15   Ozella Rocksavid J Merrell, MD   BP 121/78 mmHg  Pulse 65  Temp(Src) 98.4 F (36.9 C)  Resp 17  Ht 5\' 4"  (1.626 m)  Wt 163 lb (73.936 kg)  BMI 27.97 kg/m2  SpO2 98% Physical Exam  Constitutional: He is oriented to person, place, and time. He appears well-developed and well-nourished.  HENT:  Head: Normocephalic and atraumatic.  Right Ear: There is swelling. Tympanic membrane is injected.  Left Ear: Tympanic membrane, external ear and ear canal normal.  Mouth/Throat: No oropharyngeal exudate.  Canal swelling noted on the right  Neck: Normal range of motion. No tracheal deviation present.  Cardiovascular: Normal rate.   Pulmonary/Chest: Effort normal. No respiratory distress.  Abdominal: Soft. There is no tenderness.  Musculoskeletal: Normal range of motion.  Neurological: He is alert and oriented to person, place, and time.  Skin: Skin is warm and dry. He is not diaphoretic.  Psychiatric: He has a normal mood and affect. His behavior is normal.  Nursing note and vitals reviewed.  ED Course  Procedures  DIAGNOSTIC STUDIES: Oxygen Saturation is 98% on RA, normal by my interpretation.    COORDINATION OF CARE: 12:16 PM Pt presents today  due to unalleviated right ear pain. Discussed treatment plan with pt at bedside. Return precautions noted. Pt agreed to plan.  Labs Review Labs Reviewed - No data to display  Imaging Review No results found.   EKG Interpretation None      MDM   Final diagnoses:  Acute serous otitis media, recurrence not specified, unspecified laterality  Otitis externa, right    Oral antibiotics added and tylenol with codeine for pain. Discussed follow up and return  precautions with pt  I personally performed the services described in this documentation, which was scribed in my presence. The recorded information has been reviewed and is accurate.   Teressa Lower, NP 04/27/15 1308  Richardean Canal, MD 04/27/15 1539

## 2015-04-27 NOTE — Discharge Instructions (Signed)
Otitis externa   (Otitis Externa)   La otitis externa es una infección bacteriana en el oído externo. El oído externo es el área desde el tímpano hasta el exterior de la oreja. También se llama "oído de nadador".  CUIDADOS EN EL HOGAR   · Coloque las gotas en el oído según la prescripción médica.  · Sólo debe tomar los medicamentos como se los han recetado.  · Si sufre diabetes, su médico le puede dar más indicaciones. Siga los consejos del médico.  · Cumpla con los controles médicos según le indiquen.  Para evitar una nueva infección:   · Mantenga el oído seco. Use la punta de una toalla para secar oído después de nadar o de darse un baño.  · Evite rascarse o poner objetos en el interior del oído.  · Evite nadar en los lagos, en agua sucia, o en las piscinas que colocan poca cantidad de un producto químico llamado cloro.  · Puede usar las gotas para los oídos después de nadar. Mezcle en cantidades iguales vinagre blanco y alcohol en una botella. Ponga 3 o 4 gotas en cada oído.  SOLICITE AYUDA SI:   · Tiene fiebre.  · El oído está rojo, hinchado, le duele o sale pus después de 3 días.  · Continúa supurando líquido de color blanco amarillento (pus) que proviene del oído después de 3 días.  · El dolor, la hinchazón o el enrojecimiento empeoran.  · Siente un dolor de cabeza muy intenso.  · Tiene enrojecimiento, hinchazón, dolor o sensibilidad detrás de la oreja.  ASEGÚRESE DE QUE:   · Comprende estas instrucciones.  · Controlará su enfermedad.  · Solicitará ayuda de inmediato si no mejora o si empeora.     Esta información no tiene como fin reemplazar el consejo del médico. Asegúrese de hacerle al médico cualquier pregunta que tenga.     Document Released: 09/13/2011 Document Revised: 07/12/2014  Elsevier Interactive Patient Education ©2016 Elsevier Inc.

## 2015-04-27 NOTE — ED Notes (Signed)
Pt. stated, I've had ear pain for 7 days . Went to [facility] and gave me medicine but not working.  Its in pain and I can't hear.

## 2016-10-18 ENCOUNTER — Emergency Department (HOSPITAL_COMMUNITY)
Admission: EM | Admit: 2016-10-18 | Discharge: 2016-10-18 | Disposition: A | Discharge status: Home / Self Care | Payer: No Typology Code available for payment source | Attending: Emergency Medicine | Admitting: Emergency Medicine

## 2016-10-18 ENCOUNTER — Encounter (HOSPITAL_COMMUNITY): Payer: Self-pay

## 2016-10-18 DIAGNOSIS — Y92410 Unspecified street and highway as the place of occurrence of the external cause: Secondary | ICD-10-CM | POA: Diagnosis not present

## 2016-10-18 DIAGNOSIS — S3992XA Unspecified injury of lower back, initial encounter: Secondary | ICD-10-CM | POA: Insufficient documentation

## 2016-10-18 DIAGNOSIS — Y999 Unspecified external cause status: Secondary | ICD-10-CM | POA: Diagnosis not present

## 2016-10-18 DIAGNOSIS — Y9389 Activity, other specified: Secondary | ICD-10-CM | POA: Diagnosis not present

## 2016-10-18 MED ORDER — METHOCARBAMOL 500 MG PO TABS: 500.0000 mg | ORAL_TABLET | Freq: Two times a day (BID) | ORAL | 0 refills | Status: DC

## 2016-10-18 MED ORDER — NAPROXEN 500 MG PO TABS: 500.0000 mg | ORAL_TABLET | Freq: Two times a day (BID) | ORAL | 0 refills | Status: DC
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
## 2017-02-18 ENCOUNTER — Emergency Department (HOSPITAL_COMMUNITY)
Admission: EM | Admit: 2017-02-18 | Discharge: 2017-02-18 | Disposition: A | Discharge status: Home / Self Care | Payer: Self-pay | Attending: Emergency Medicine | Admitting: Emergency Medicine

## 2017-02-18 ENCOUNTER — Encounter (HOSPITAL_COMMUNITY): Payer: Self-pay | Admitting: Emergency Medicine

## 2017-02-18 DIAGNOSIS — Z79899 Other long term (current) drug therapy: Secondary | ICD-10-CM | POA: Insufficient documentation

## 2017-02-18 DIAGNOSIS — H9193 Unspecified hearing loss, bilateral: Secondary | ICD-10-CM | POA: Insufficient documentation

## 2017-02-18 DIAGNOSIS — H60313 Diffuse otitis externa, bilateral: Secondary | ICD-10-CM | POA: Insufficient documentation

## 2017-02-18 MED ORDER — CIPROFLOXACIN-HYDROCORTISONE 0.2-1 % OT SUSP: 3.0000 [drp] | Freq: Two times a day (BID) | OTIC | 0 refills | Status: DC

## 2017-02-18 MED ORDER — IBUPROFEN 800 MG PO TABS: 800.0000 mg | ORAL_TABLET | Freq: Three times a day (TID) | ORAL | 0 refills | Status: AC

## 2017-02-18 MED ORDER — CIPROFLOXACIN-HYDROCORTISONE 0.2-1 % OT SUSP
3.0000 [drp] | Freq: Two times a day (BID) | OTIC | Status: DC
  Administered 2017-02-18: 3 [drp] via OTIC
  Filled 2017-02-18: qty 10

## 2017-02-18 MED ORDER — NAPROXEN 500 MG PO TABS: 500.0000 mg | ORAL_TABLET | Freq: Two times a day (BID) | ORAL | 0 refills | Status: DC

## 2017-02-18 NOTE — Discharge Instructions (Signed)
Purchase ear wick and place them in the ear 2 times daily to dry the ear canal content.  Take the medicine we prescribed.  Return to the ER if the pain gets severe, you start having headaches, fevers, chills.  If not getting better despite the medicine, see the ENT doctors in 5 days.

## 2017-02-18 NOTE — ED Triage Notes (Signed)
Pt. reports bilateral earache onset Sunday , denies injury , no bleeding or drainage .

## 2017-02-21 NOTE — ED Provider Notes (Signed)
[facility]-EMERGENCY DEPT Provider Note   CSN: [id number] Arrival date & time: 02/18/17  4098     History   Chief Complaint Chief Complaint  Patient presents with  . Otalgia    HPI Brandon Irwin is a 36 y.o. male.  HPI Pt comes in with cc earache. Pt's earache started 5 days ago, and started with L ear and now is present in both ears. Pt has no bleeding. He has been taking amoxi and applying lidocaine topical meds with transient relief. Now that both the ears are involved, he is concerned. Pt has no n/v/f/c/headaches. He has no sig medical hx. Pt denies any recent swimming. Pt denies ringing in the ear, but the hearing does appear to have gotten worse to the patient.  History reviewed. No pertinent past medical history.  There are no active problems to display for this patient.   History reviewed. No pertinent surgical history.     Home Medications    Prior to Admission medications   Medication Sig Start Date End Date Taking? Authorizing Provider  acetaminophen-codeine 120-12 MG/5ML suspension Take 5 mLs by mouth every 6 (six) hours as needed for pain. 04/27/15   Teressa Lower, NP  artificial tears (LACRILUBE) OINT ophthalmic ointment Apply to eye every 4 (four) hours as needed. Patient not taking: Reported on 04/01/2015 01/15/13   Rodolph Bong, MD  azelastine (OPTIVAR) 0.05 % ophthalmic solution Place 1 drop into the left eye 2 (two) times daily. 04/01/15   Peyton Najjar, MD  ciprofloxacin-hydrocortisone (CIPRO HC) OTIC suspension Place 3 drops into both ears 2 (two) times daily. 02/18/17   Derwood Kaplan, MD  clotrimazole (LOTRIMIN) 1 % cream Apply 1 application topically 2 (two) times daily. 04/30/14   Trixie Dredge, PA-C  hydrocortisone-pramoxine (ANALPRAM HC) 2.5-1 % rectal cream Place 1 application rectally 3 (three) times daily. 04/01/15   Peyton Najjar, MD  ibuprofen (ADVIL,MOTRIN) 800 MG tablet Take 1 tablet (800 mg total) by mouth 3 (three) times daily. 02/18/17  02/22/17  Little, Ambrose Finland, MD  methocarbamol (ROBAXIN) 500 MG tablet Take 1 tablet (500 mg total) by mouth 2 (two) times daily. 10/18/16   Felicie Morn, NP  naproxen (NAPROSYN) 500 MG tablet Take 1 tablet (500 mg total) by mouth 2 (two) times daily. 02/18/17   Derwood Kaplan, MD  neomycin-polymyxin-hydrocortisone (CORTISPORIN) otic solution 3-4 Drops, 4 times per day for 7 days 04/24/15   Ozella Rocks, MD    Family History Family History  Problem Relation Age of Onset  . Cancer Neg Hx   . Diabetes Neg Hx   . Heart failure Neg Hx     Social History Social History  Substance Use Topics  . Smoking status: Never Smoker  . Smokeless tobacco: Never Used  . Alcohol use 0.0 oz/week     Allergies   Patient has no known allergies.   Review of Systems Review of Systems  Constitutional: Negative for fever.  HENT: Positive for ear pain and hearing loss. Negative for congestion and ear discharge.   Eyes: Negative for pain.  Allergic/Immunologic: Negative for immunocompromised state.     Physical Exam Updated Vital Signs BP 120/78 (BP Location: Left Arm)   Pulse 66   Temp 97.9 F (36.6 C) (Oral)   Resp 16   Ht 5\' 6"  (1.676 m)   Wt 76.2 kg (168 lb)   SpO2 99%   BMI 27.12 kg/m   Physical Exam  Constitutional: He is oriented to person, place, and time.  He appears well-developed.  HENT:  Head: Atraumatic.  Right Ear: There is drainage, swelling and tenderness. No foreign bodies. Decreased hearing is noted.  Left Ear: There is drainage, swelling and tenderness. No foreign bodies. Decreased hearing is noted.  Eyes: Conjunctivae and EOM are normal.  Neck: Neck supple.  Cardiovascular: Normal rate.   Pulmonary/Chest: Effort normal.  Neurological: He is alert and oriented to person, place, and time.  Skin: Skin is warm.  Nursing note and vitals reviewed.    ED Treatments / Results  Labs (all labs ordered are listed, but only abnormal results are displayed) Labs  Reviewed - No data to display  EKG  EKG Interpretation None       Radiology No results found.  Procedures .Ear Cerumen Removal Date/Time: 02/18/2017 7:25 AM Performed by: Derwood Kaplan Authorized by: Derwood Kaplan   Consent:    Consent obtained:  Verbal   Consent given by:  Patient   Risks discussed:  Infection, pain and incomplete removal   Alternatives discussed:  No treatment Procedure details:    Location:  L ear and R ear   Procedure type: irrigation   Post-procedure details:    Inspection:  TM intact   Patient tolerance of procedure:  Tolerated well, no immediate complications   (including critical care time)  Medications Ordered in ED Medications - No data to display   Initial Impression / Assessment and Plan / ED Course  I have reviewed the triage vital signs and the nursing notes.  Pertinent labs & imaging results that were available during my care of the patient were reviewed by me and considered in my medical decision making (see chart for details).     Pt comes in with bilateral earpain. He is noted to have swelling in both of external ear canal, and there is some debri present as well in the canal. He has been applying lidocaine oinment to the ear, so we flushed the ear canal and examined hi post irrigation - and he contineus to have edema and debri, worse in the L ear. Plan is to treat pt as OE. Pt is non toxic and has no headaches or neurologic complains. If not getting better after 3 days of tx, he has been advised to call ENT. We will provide him with cipro otic megs. We advised wicking the ear to the patient as well.  Strict ER return precautions have been discussed, and patient is agreeing with the plan and is comfortable with the workup done and the recommendations from the ER.   Final Clinical Impressions(s) / ED Diagnoses   Final diagnoses:  Acute diffuse otitis externa of both ears    New Prescriptions Discharge Medication List as  of 02/18/2017  8:17 AM    START taking these medications   Details  ciprofloxacin-hydrocortisone (CIPRO HC) OTIC suspension Place 3 drops into both ears 2 (two) times daily., Starting Fri 02/18/2017, Print         Derwood Kaplan, MD 02/21/17 [phone]

## 2017-09-23 ENCOUNTER — Ambulatory Visit: Payer: Self-pay | Admitting: Physician Assistant

## 2017-11-21 ENCOUNTER — Other Ambulatory Visit: Payer: Self-pay

## 2017-11-21 ENCOUNTER — Ambulatory Visit (INDEPENDENT_AMBULATORY_CARE_PROVIDER_SITE_OTHER): Payer: Self-pay | Admitting: Family Medicine

## 2017-11-21 ENCOUNTER — Encounter: Payer: Self-pay | Admitting: Family Medicine

## 2017-11-21 VITALS — BP 110/68 | HR 59 | Temp 98.1°F | Ht 65.0 in | Wt 169.6 lb

## 2017-11-21 DIAGNOSIS — B356 Tinea cruris: Secondary | ICD-10-CM

## 2017-11-21 DIAGNOSIS — B354 Tinea corporis: Secondary | ICD-10-CM

## 2017-11-21 MED ORDER — FLUCONAZOLE 150 MG PO TABS: 150.0000 mg | ORAL_TABLET | ORAL | 0 refills | Status: DC

## 2017-11-21 MED ORDER — CLOTRIMAZOLE 1 % EX CREA: 1.0000 "application " | TOPICAL_CREAM | Freq: Two times a day (BID) | CUTANEOUS | 0 refills | Status: DC

## 2017-11-21 NOTE — Progress Notes (Signed)
Subjective:  By signing my name below, I, Brandon Irwin, attest that this documentation has been prepared under the direction and in the presence of Meredith Staggers, MD. Electronically Signed: Stann Irwin, Scribe. 11/21/2017 , 10:08 AM .  Patient was seen in Room 11 .   Patient ID: Brandon Irwin, male    DOB: March 09, 1981, 37 y.o.   MRN: [id number] Chief Complaint  Patient presents with  . Rash    on left arm and had. maybe fungal rash. ichy all body. lasted for 2 months and growing. in groin area as well.    HPI Brandon Irwin is a 37 y.o. male  Patient presents with rash that started in his upper legs about 2 months ago. He then noticed similar rash in his left hand and bilateral arms about 1 month ago. He has similar rash during the summer in the past. He's been applying OTC hydrocortisone cream and "Crisaborole" cream.   Patient's primary language is Spanish, but can understand some [place].   There are no active problems to display for this patient.  No past medical history on file. No past surgical history on file. No Known Allergies Prior to Admission medications   Medication Sig Start Date End Date Taking? Authorizing Provider  acetaminophen-codeine 120-12 MG/5ML suspension Take 5 mLs by mouth every 6 (six) hours as needed for pain. 04/27/15   Teressa Lower, NP  artificial tears (LACRILUBE) OINT ophthalmic ointment Apply to eye every 4 (four) hours as needed. Patient not taking: Reported on 04/01/2015 01/15/13   Rodolph Bong, MD  azelastine (OPTIVAR) 0.05 % ophthalmic solution Place 1 drop into the left eye 2 (two) times daily. 04/01/15   Peyton Najjar, MD  ciprofloxacin-hydrocortisone (CIPRO HC) OTIC suspension Place 3 drops into both ears 2 (two) times daily. 02/18/17   Derwood Kaplan, MD  clotrimazole (LOTRIMIN) 1 % cream Apply 1 application topically 2 (two) times daily. 04/30/14   Trixie Dredge, PA-C  hydrocortisone-pramoxine (ANALPRAM HC) 2.5-1 % rectal cream Place 1  application rectally 3 (three) times daily. 04/01/15   Peyton Najjar, MD  methocarbamol (ROBAXIN) 500 MG tablet Take 1 tablet (500 mg total) by mouth 2 (two) times daily. 10/18/16   Felicie Morn, NP  naproxen (NAPROSYN) 500 MG tablet Take 1 tablet (500 mg total) by mouth 2 (two) times daily. 02/18/17   Derwood Kaplan, MD  neomycin-polymyxin-hydrocortisone (CORTISPORIN) otic solution 3-4 Drops, 4 times per day for 7 days 04/24/15   Ozella Rocks, MD   Social History   Socioeconomic History  . Marital status: Significant Other    Spouse name: Not on file  . Number of children: Not on file  . Years of education: Not on file  . Highest education level: Not on file  Occupational History  . Not on file  Social Needs  . Financial resource strain: Not on file  . Food insecurity:    Worry: Not on file    Inability: Not on file  . Transportation needs:    Medical: Not on file    Non-medical: Not on file  Tobacco Use  . Smoking status: Never Smoker  . Smokeless tobacco: Never Used  Substance and Sexual Activity  . Alcohol use: Yes    Alcohol/week: 0.0 oz  . Drug use: No  . Sexual activity: Yes  Lifestyle  . Physical activity:    Days per week: Not on file    Minutes per session: Not on file  . Stress: Not on file  Relationships  . Social connections:    Talks on phone: Not on file    Gets together: Not on file    Attends religious service: Not on file    Active member of club or organization: Not on file    Attends meetings of clubs or organizations: Not on file    Relationship status: Not on file  . Intimate partner violence:    Fear of current or ex partner: Not on file    Emotionally abused: Not on file    Physically abused: Not on file    Forced sexual activity: Not on file  Other Topics Concern  . Not on file  Social History Narrative  . Not on file   Review of Systems  Constitutional: Negative for fatigue and unexpected weight change.  Eyes: Negative for visual  disturbance.  Respiratory: Negative for cough, chest tightness and shortness of breath.   Cardiovascular: Negative for chest pain, palpitations and leg swelling.  Gastrointestinal: Negative for abdominal pain and blood in stool.  Skin: Positive for rash.  Neurological: Negative for dizziness, light-headedness and headaches.       Objective:   Physical Exam  Constitutional: He is oriented to person, place, and time. He appears well-developed and well-nourished. No distress.  HENT:  Head: Normocephalic and atraumatic.  Eyes: Pupils are equal, round, and reactive to light. EOM are normal.  Neck: Neck supple.  Cardiovascular: Normal rate.  Pulmonary/Chest: Effort normal. No respiratory distress.  Musculoskeletal: Normal range of motion.  Neurological: He is alert and oriented to person, place, and time.  Skin: Skin is warm and dry. Rash noted.  Groin: there is a rash with patches, patch in the bilateral inguinal folds lateral to scrotum, diffuse rash in inguinal folds with somewhat undulating border worse L>R, slight border with faint scale bilaterally  Left hand: slightly thickened area with excoriation approximately 1.5cm x 1cm Left dorsal forearm: few scattered patches of excoriation Toes: appear normal, no rash on feet, slight dry skin of feet, there is also dry skin between his toes  Psychiatric: He has a normal mood and affect. His behavior is normal.  Nursing note and vitals reviewed.   Vitals:   11/21/17 0932  BP: 110/68  Pulse: (!) 59  Temp: 98.1 F (36.7 C)  TempSrc: Oral  SpO2: 100%  Weight: 169 lb 9.6 oz (76.9 kg)  Height:  (1.651 m)       Assessment & Plan:    Brandon Irwin is a 37 y.o. male Tinea cruris - Plan: fluconazole (DIFLUCAN) 150 MG tablet, clotrimazole (LOTRIMIN) 1 % cream  Tinea corporis - Plan: fluconazole (DIFLUCAN) 150 MG tablet, clotrimazole (LOTRIMIN) 1 % cream Suspected tinea cruris, may have component of tinea corporis as well with  some pruritic/dry skin on arms.  Differential in arms also includes tinea versicolor, but very minimal hypopigmentation and may be more of an excoriation.  -Fluconazole once per week for 4 weeks, clotrimazole topical to affected areas twice daily, has hydrocortisone cream at home that he can use to 3 times per day as needed, Claritin over-the-counter once per day if needed for itching, and avoid strong soaps, recommended Dove or Aveeno for now.  RTC precautions if not improving next 3 to 4 weeks, sooner if worse.  Spanish spoken with understanding expressed.   Meds ordered this encounter  Medications  . fluconazole (DIFLUCAN) 150 MG tablet    Sig: Take 1 tablet (150 mg total) by mouth once a week.  Dispense:  4 tablet    Refill:  0  . clotrimazole (LOTRIMIN) 1 % cream    Sig: Apply 1 application topically 2 (two) times daily.    Dispense:  30 g    Refill:  0   Patient Instructions    Fluconazole 1 pastilla cada semana Clotrimazole 2 veces al dia Hydrocortisone crema 2 veces a 3 veces cada dia si necesario.  Claritin al dia si necesario por pica.  Dove o aveeno jabon.  regrese si simptomas empeoran, o no estan mejor en un mes.   Tia inguinal (Jock Itch) La tia inguinal (tia crural) es una micosis de la piel que aparece en la zona de la ingle. En ocasiones recibe el nombre de dermatofitosis de la ingle. Es causada por un hongo, que es un tipo de bacteria que se desarrolla en lugares oscuros y hmedos. La tia inguinal produce una erupcin cutnea y picazn en la ingle y la zona alta del muslo. Generalmente, desaparece despus de 2 o 3semanas de tratamiento. CAUSAS El hongo que causa la tia inguinal puede diseminarse de la siguiente forma:  Al tocar una micosis en cualquier otra parte del cuerpo, por ejemplo, el pie de atleta, y luego tocarse la zona de la ingle.  Compartir las toallas o la ropa con una persona infectada. FACTORES DE RIESGO La tia inguinal es ms frecuente en  los hombres adultos y [place]. Es ms probable que esta afeccin aparezca debido a lo siguiente:  Estar en climas clidos y hmedos.  Usar ropa ajustada o trajes de bao hmedos durante perodos largos de [place].  Practicar deportes.  Tener sobrepeso.  Tener diabetes. SNTOMAS Los sntomas de la tia inguinal pueden incluir lo siguiente:  Neomia Dear erupcin cutnea de color rojo, rosa o marrn en la zona inguinal. La erupcin puede extenderse a los muslos, el ano y los glteos.  Piel seca y escamosa en la erupcin cutnea o alrededor de esta.  Picazn. DIAGNSTICO La mayora de las veces, un mdico puede hacer el diagnstico al observar la erupcin cutnea. En ocasiones, se har un raspado de la piel infectada. Para analizar [employer], se la estudia con un microscopio o se Astronomer crecer el hongo de la [place] (cultivo). TRATAMIENTO El tratamiento para esta afeccin puede incluir lo siguiente:  Antimicticos para eliminar el hongo. Estos medicamentos pueden tener varias presentaciones: ? Crema o ungento para la piel. ? Medicamentos por va oral.  Crema o ungento para la piel para Associate Professor.  Compresas o talcos medicinales para secar la piel infectada. INSTRUCCIONES PARA EL CUIDADO EN EL HOGAR  Tome los medicamentos solamente como se lo haya indicado el mdico. Aplique las cremas o los ungentos para la piel exactamente como se lo hayan indicado.  Use ropas sueltas. ? Los hombres deben usar calzoncillos tipo bxer de algodn y ? las mujeres, ropa interior de este mismo material.  Doctor, general practice la ropa interior todos los [address]  [phone] para mantener seca la zona de la ingle.  No se d baos de inmersin calientes.  Squese bien la zona inguinal despus de baarse. ? Use una toalla diferente para secarse la zona afectada. Esto evitar que la infeccin se disemine a otras zonas del cuerpo.  No se rasque la zona afectada.  No comparta las toallas con Metallurgist. SOLICITE ATENCIN MDICA SI:  La erupcin cutnea no mejora o empeora despus de 2semanas de tratamiento.  La erupcin cutnea se est extendiendo.  La erupcin cutnea vuelve a aparecer despus de  finalizado el tratamiento.  Tiene fiebre.  Tiene enrojecimiento, hinchazn o dolor alrededor de la erupcin cutnea.  Observa lquido, sangre o pus que salen de la erupcin cutnea.  Tiene la erupcin cutnea durante ms de 4semanas. Esta informacin no tiene Theme park manager el consejo del mdico. Asegrese de hacerle al mdico cualquier pregunta que tenga. Document Released: 03/31/2005 Document Revised: 07/12/2014 Document Reviewed: 04/02/2014 Elsevier Interactive Patient Education  2018 [employer].  Tia corporal (Body Ringworm) La tia corporal es una infeccin de la piel que suele causar una erupcin en forma de anillos. Puede afectar cualquier zona de la piel. Esta afeccin puede propagarse fcilmente a Economist. A la tia corporal tambin se la conoce como tinea corporis. CAUSAS Esta afeccin es causada por hongos llamados dermatofitos. Se manifiesta cuando estos hongos crecen sin control en la piel. Es posible contagiarse la afeccin al tocar a Physiological scientist persona o un animal que la tiene. Tambin, al [employer], ropa de cama, toallas o cualquier otro objeto con Physiological scientist persona o una mascota infectada. FACTORES DE RIESGO Es ms probable que esta afeccin se manifieste en:  Los atletas que suelen tener contacto piel a piel con otros atletas, como los luchadores.  Las personas que comparten equipos y Development worker, international aid.  Las personas con el sistema inmunitario debilitado. SNTOMAS Los sntomas de esta afeccin incluyen lo siguiente:  Manchas y bultos rojos elevados que causan picazn.  Manchas rojas escamosas.  Una erupcin en forma de anillos. La erupcin cutnea puede consistir en lo siguiente: ? Un centro claro. ? Escamas o bultos rojos en el  medio. ? Enrojecimiento cerca de los bordes. ? Piel seca y escamosa dentro o alrededor de ella. DIAGNSTICO Generalmente, esta afeccin puede diagnosticarse con un examen de la piel. Se puede tomar una muestra de la piel de la zona afectada y examinarla con un microscopio para determinar si hay hongos. TRATAMIENTO El tratamiento de esta afeccin puede incluir lo siguiente:  Neomia Dear crema o una pomada antimictica.  Un champ antimictico.  Medicamentos antimicticos. Le pueden recetar estos productos si la tia es grave, si reaparece o si se prolonga durante mucho tiempo. INSTRUCCIONES PARA EL CUIDADO EN EL HOGAR  Tome los medicamentos de venta libre y los recetados solamente como se lo haya indicado el mdico.  Si le indicaron una crema o una pomada antimictica: ? selo como se lo haya indicado el mdico. ? Lave el rea de la infeccin y squela bien antes de aplicar la crema o la pomada.  Si le indicaron un champ antimictico: ? selo como se lo haya indicado el mdico. ? Deje actuar el champ en el cuerpo durante 3 a antes de enjuagarlo.  Mientras tiene la erupcin: ? Use ropa suelta para evitar roces e irritacin. ? Lave o cambie las sbanas cada noche.  Si su mascota tiene la misma infeccin, llvela al veterinario. PREVENCIN  Mantenga una buena higiene.  Use sandalias o zapatos en lugares pblicos y duchas.  No comparta los artculos de uso personal con Economist.  Evite tocar las [address] rojas de piel de Economist.  No toque las mascotas que tienen zonas sin pelos.  Si lo hace, lvese las manos. SOLICITE ATENCIN MDICA SI:  La erupcin contina diseminndose despus de 7 das de [place].  No se cura en el trmino de 4 semanas.  El rea alrededor de la erupcin se vuelve roja, est caliente al tacto, le duele o se hincha. Esta informacin no tiene Theme park manager el consejo  del mdico. Asegrese de hacerle al mdico cualquier  pregunta que tenga. Document Released: 03/31/2005 Document Revised: 10/13/2015 Document Reviewed: 04/17/2015 Elsevier Interactive Patient Education  2018 [employer].    IF you received an x-ray today, you will receive an invoice from [facility]. Please contact [facility] at [phone] with questions or concerns regarding your invoice.   IF you received labwork today, you will receive an invoice from [facility]. Please contact [facility] at [phone] with questions or concerns regarding your invoice.   Our billing staff will not be able to assist you with questions regarding bills from these companies.  You will be contacted with the lab results as soon as they are available. The fastest way to get your results is to activate your My Chart account. Instructions are located on the last page of this paperwork. If you have not heard from [place] regarding the results in 2 weeks, please contact this office.       I personally performed the services described in this documentation, which was scribed in my presence. The recorded information has been reviewed and considered for accuracy and completeness, addended by me as needed, and agree with information above.  Signed,   Meredith Staggers, MD Primary [facility] at [facility].  11/21/17 10:15 AM

## 2017-11-21 NOTE — Patient Instructions (Addendum)
Fluconazole 1 pastilla cada semana Clotrimazole 2 veces al dia Hydrocortisone crema 2 veces a 3 veces cada dia si necesario.  Claritin al dia si necesario por pica.  Dove o aveeno jabon.  regrese si simptomas empeoran, o no estan mejor en un mes.   Tia inguinal (Jock Itch) La tia inguinal (tia crural) es una micosis de la piel que aparece en la zona de la ingle. En ocasiones recibe el nombre de dermatofitosis de la ingle. Es causada por un hongo, que es un tipo de bacteria que se desarrolla en lugares oscuros y hmedos. La tia inguinal produce una erupcin cutnea y picazn en la ingle y la zona alta del muslo. Generalmente, desaparece despus de 2 o 3semanas de tratamiento. CAUSAS El hongo que causa la tia inguinal puede diseminarse de la siguiente forma:  Al tocar una micosis en cualquier otra parte del cuerpo, por ejemplo, el pie de atleta, y luego tocarse la zona de la ingle.  Compartir las toallas o la ropa con una persona infectada. FACTORES DE RIESGO La tia inguinal es ms frecuente en los hombres adultos y [place]. Es ms probable que esta afeccin aparezca debido a lo siguiente:  Estar en climas clidos y hmedos.  Usar ropa ajustada o trajes de bao hmedos durante perodos largos de [place].  Practicar deportes.  Tener sobrepeso.  Tener diabetes. SNTOMAS Los sntomas de la tia inguinal pueden incluir lo siguiente:  Neomia Dear erupcin cutnea de color rojo, rosa o marrn en la zona inguinal. La erupcin puede extenderse a los muslos, el ano y los glteos.  Piel seca y escamosa en la erupcin cutnea o alrededor de esta.  Picazn. DIAGNSTICO La mayora de las veces, un mdico puede hacer el diagnstico al observar la erupcin cutnea. En ocasiones, se har un raspado de la piel infectada. Para analizar [employer], se la estudia con un microscopio o se Astronomer crecer el hongo de la [place] (cultivo). TRATAMIENTO El tratamiento para esta afeccin puede  incluir lo siguiente:  Antimicticos para eliminar el hongo. Estos medicamentos pueden tener varias presentaciones: ? Crema o ungento para la piel. ? Medicamentos por va oral.  Crema o ungento para la piel para Associate Professor.  Compresas o talcos medicinales para secar la piel infectada. INSTRUCCIONES PARA EL CUIDADO EN EL HOGAR  Tome los medicamentos solamente como se lo haya indicado el mdico. Aplique las cremas o los ungentos para la piel exactamente como se lo hayan indicado.  Use ropas sueltas. ? Los hombres deben usar calzoncillos tipo bxer de algodn y ? las mujeres, ropa interior de este mismo material.  Doctor, general practice la ropa interior todos los [address]  [phone] para mantener seca la zona de la ingle.  No se d baos de inmersin calientes.  Squese bien la zona inguinal despus de baarse. ? Use una toalla diferente para secarse la zona afectada. Esto evitar que la infeccin se disemine a otras zonas del cuerpo.  No se rasque la zona afectada.  No comparta las toallas con Economist. SOLICITE ATENCIN MDICA SI:  La erupcin cutnea no mejora o empeora despus de 2semanas de tratamiento.  La erupcin cutnea se est extendiendo.  La erupcin cutnea vuelve a aparecer despus de finalizado el tratamiento.  Tiene fiebre.  Tiene enrojecimiento, hinchazn o dolor alrededor de la erupcin cutnea.  Observa lquido, sangre o pus que salen de la erupcin cutnea.  Tiene la erupcin cutnea durante ms de 4semanas. Esta informacin no tiene Theme park manager el consejo del mdico. Manufacturing engineer  de hacerle al mdico cualquier pregunta que tenga. Document Released: 03/31/2005 Document Revised: 07/12/2014 Document Reviewed: 04/02/2014 Elsevier Interactive Patient Education  2018 [employer].  Tia corporal (Body Ringworm) La tia corporal es una infeccin de la piel que suele causar una erupcin en forma de anillos. Puede afectar cualquier zona de la piel. Esta afeccin  puede propagarse fcilmente a Economist. A la tia corporal tambin se la conoce como tinea corporis. CAUSAS Esta afeccin es causada por hongos llamados dermatofitos. Se manifiesta cuando estos hongos crecen sin control en la piel. Es posible contagiarse la afeccin al tocar a Physiological scientist persona o un animal que la tiene. Tambin, al [employer], ropa de cama, toallas o cualquier otro objeto con Physiological scientist persona o una mascota infectada. FACTORES DE RIESGO Es ms probable que esta afeccin se manifieste en:  Los atletas que suelen tener contacto piel a piel con otros atletas, como los luchadores.  Las personas que comparten equipos y Development worker, international aid.  Las personas con el sistema inmunitario debilitado. SNTOMAS Los sntomas de esta afeccin incluyen lo siguiente:  Manchas y bultos rojos elevados que causan picazn.  Manchas rojas escamosas.  Una erupcin en forma de anillos. La erupcin cutnea puede consistir en lo siguiente: ? Un centro claro. ? Escamas o bultos rojos en el medio. ? Enrojecimiento cerca de los bordes. ? Piel seca y escamosa dentro o alrededor de ella. DIAGNSTICO Generalmente, esta afeccin puede diagnosticarse con un examen de la piel. Se puede tomar una muestra de la piel de la zona afectada y examinarla con un microscopio para determinar si hay hongos. TRATAMIENTO El tratamiento de esta afeccin puede incluir lo siguiente:  Neomia Dear crema o una pomada antimictica.  Un champ antimictico.  Medicamentos antimicticos. Le pueden recetar estos productos si la tia es grave, si reaparece o si se prolonga durante mucho tiempo. INSTRUCCIONES PARA EL CUIDADO EN EL HOGAR  Tome los medicamentos de venta libre y los recetados solamente como se lo haya indicado el mdico.  Si le indicaron una crema o una pomada antimictica: ? selo como se lo haya indicado el mdico. ? Lave el rea de la infeccin y squela bien antes de aplicar la crema o la pomada.  Si le indicaron un  champ antimictico: ? selo como se lo haya indicado el mdico. ? Deje actuar el champ en el cuerpo durante 3 a antes de enjuagarlo.  Mientras tiene la erupcin: ? Use ropa suelta para evitar roces e irritacin. ? Lave o cambie las sbanas cada noche.  Si su mascota tiene la misma infeccin, llvela al veterinario. PREVENCIN  Mantenga una buena higiene.  Use sandalias o zapatos en lugares pblicos y duchas.  No comparta los artculos de uso personal con Economist.  Evite tocar las [address] rojas de piel de Economist.  No toque las mascotas que tienen zonas sin pelos.  Si lo hace, lvese las manos. SOLICITE ATENCIN MDICA SI:  La erupcin contina diseminndose despus de 7 das de [place].  No se cura en el trmino de 4 semanas.  El rea alrededor de la erupcin se vuelve roja, est caliente al tacto, le duele o se hincha. Esta informacin no tiene Theme park manager el consejo del mdico. Asegrese de hacerle al mdico cualquier pregunta que tenga. Document Released: 03/31/2005 Document Revised: 10/13/2015 Document Reviewed: 04/17/2015 Elsevier Interactive Patient Education  2018 [employer].    IF you received an x-ray today, you will receive an invoice from [facility]. Please contact [facility]  at [phone] with questions or concerns regarding your invoice.   IF you received labwork today, you will receive an invoice from [facility]. Please contact [facility] at [phone] with questions or concerns regarding your invoice.   Our billing staff will not be able to assist you with questions regarding bills from these companies.  You will be contacted with the lab results as soon as they are available. The fastest way to get your results is to activate your My Chart account. Instructions are located on the last page of this paperwork. If you have not heard from [place] regarding the results in 2 weeks, please contact this office.

## 2018-01-07 ENCOUNTER — Emergency Department (HOSPITAL_COMMUNITY)
Admission: EM | Admit: 2018-01-07 | Discharge: 2018-01-07 | Disposition: A | Discharge status: Home / Self Care | Payer: Self-pay | Attending: Emergency Medicine | Admitting: Emergency Medicine

## 2018-01-07 ENCOUNTER — Other Ambulatory Visit: Payer: Self-pay

## 2018-01-07 ENCOUNTER — Emergency Department (HOSPITAL_COMMUNITY): Payer: Self-pay

## 2018-01-07 ENCOUNTER — Encounter (HOSPITAL_COMMUNITY): Payer: Self-pay | Admitting: *Deleted

## 2018-01-07 DIAGNOSIS — S32028A Other fracture of second lumbar vertebra, initial encounter for closed fracture: Secondary | ICD-10-CM | POA: Insufficient documentation

## 2018-01-07 DIAGNOSIS — W108XXA Fall (on) (from) other stairs and steps, initial encounter: Secondary | ICD-10-CM | POA: Insufficient documentation

## 2018-01-07 DIAGNOSIS — Y999 Unspecified external cause status: Secondary | ICD-10-CM | POA: Insufficient documentation

## 2018-01-07 DIAGNOSIS — Y92018 Other place in single-family (private) house as the place of occurrence of the external cause: Secondary | ICD-10-CM | POA: Insufficient documentation

## 2018-01-07 DIAGNOSIS — W19XXXA Unspecified fall, initial encounter: Secondary | ICD-10-CM

## 2018-01-07 DIAGNOSIS — S32009A Unspecified fracture of unspecified lumbar vertebra, initial encounter for closed fracture: Secondary | ICD-10-CM

## 2018-01-07 DIAGNOSIS — Y9389 Activity, other specified: Secondary | ICD-10-CM | POA: Insufficient documentation

## 2018-01-07 DIAGNOSIS — R1031 Right lower quadrant pain: Secondary | ICD-10-CM | POA: Insufficient documentation

## 2018-01-07 LAB — CBC WITH DIFFERENTIAL/PLATELET
Abs Immature Granulocytes: 0 10*3/uL (ref 0.0–0.1)
Basophils Absolute: 0 10*3/uL (ref 0.0–0.1)
Basophils Relative: 0 %
Eosinophils Absolute: 0.5 10*3/uL (ref 0.0–0.7)
Eosinophils Relative: 6 %
HEMATOCRIT: 41.7 % (ref 39.0–52.0)
HEMOGLOBIN: 13.5 g/dL (ref 13.0–17.0)
IMMATURE GRANULOCYTES: 0 %
LYMPHS ABS: 3.2 10*3/uL (ref 0.7–4.0)
LYMPHS PCT: 41 %
MCH: 28.2 pg (ref 26.0–34.0)
MCHC: 32.4 g/dL (ref 30.0–36.0)
MCV: 87.1 fL (ref 78.0–100.0)
MONOS PCT: 10 %
Monocytes Absolute: 0.8 10*3/uL (ref 0.1–1.0)
NEUTROS PCT: 43 %
Neutro Abs: 3.3 10*3/uL (ref 1.7–7.7)
Platelets: 329 10*3/uL (ref 150–400)
RBC: 4.79 MIL/uL (ref 4.22–5.81)
RDW: 13.1 % (ref 11.5–15.5)
WBC: 7.7 10*3/uL (ref 4.0–10.5)

## 2018-01-07 LAB — URINALYSIS, ROUTINE W REFLEX MICROSCOPIC
BILIRUBIN URINE: NEGATIVE
GLUCOSE, UA: NEGATIVE mg/dL
HGB URINE DIPSTICK: NEGATIVE
Ketones, ur: NEGATIVE mg/dL
Leukocytes, UA: NEGATIVE
Nitrite: NEGATIVE
PROTEIN: NEGATIVE mg/dL
Specific Gravity, Urine: 1.004 — ABNORMAL LOW (ref 1.005–1.030)
pH: 6 (ref 5.0–8.0)

## 2018-01-07 LAB — COMPREHENSIVE METABOLIC PANEL
ALK PHOS: 66 U/L (ref 38–126)
ALT: 38 U/L (ref 0–44)
AST: 30 U/L (ref 15–41)
Albumin: 3.8 g/dL (ref 3.5–5.0)
Anion gap: 7 (ref 5–15)
BILIRUBIN TOTAL: 0.5 mg/dL (ref 0.3–1.2)
BUN: 11 mg/dL (ref 6–20)
CALCIUM: 9 mg/dL (ref 8.9–10.3)
CO2: 25 mmol/L (ref 22–32)
CREATININE: 0.98 mg/dL (ref 0.61–1.24)
Chloride: 107 mmol/L (ref 98–111)
GFR calc Af Amer: >60 mL/min (ref >60)
GLUCOSE: 86 mg/dL (ref 70–99)
Potassium: 3.9 mmol/L (ref 3.5–5.1)
Sodium: 139 mmol/L (ref 135–145)
TOTAL PROTEIN: 7.3 g/dL (ref 6.5–8.1)

## 2018-01-07 MED ORDER — IOHEXOL 300 MG/ML  SOLN
100.0000 mL | Freq: Once | INTRAMUSCULAR | Status: AC | PRN
  Administered 2018-01-07: 100 mL via INTRAVENOUS

## 2018-01-07 MED ORDER — HYDROCODONE-ACETAMINOPHEN 5-325 MG PO TABS: 2.0000 | ORAL_TABLET | Freq: Three times a day (TID) | ORAL | 0 refills | Status: DC | PRN

## 2018-01-07 MED ORDER — IBUPROFEN 600 MG PO TABS: 600.0000 mg | ORAL_TABLET | Freq: Four times a day (QID) | ORAL | 0 refills | Status: DC | PRN

## 2018-01-07 NOTE — ED Triage Notes (Signed)
The pt is c/o rt flank pain since Wednesday when he fell  And struck his back on the concrete  He speaks english but is hispanic and want an in intrepreter

## 2018-01-07 NOTE — ED Notes (Signed)
Pt transported to xray 

## 2018-01-07 NOTE — ED Notes (Signed)
Patient ambulated independently to room, gait steady and even.

## 2018-01-07 NOTE — ED Notes (Signed)
Pt ambulated to the bathroom without difficulty or instability.

## 2018-01-07 NOTE — ED Provider Notes (Signed)
[facility] Provider Note   CSN: [id number] Arrival date & time: 01/07/18  1608     History   Chief Complaint Chief Complaint  Patient presents with  . Back Pain   Pt is spanish speaking and a phone interpretor was used throughout the evaluation.  HPI Brandon Irwin is a 37 y.o. male.  HPI   Pt is a 37 y/o with no significant past medical history who presents to the ED today for evaluation of right sided flank pain that began after he had a fall 4 days ago. He states that he was walking up the stairs into his home, slipped and fell. Estimates that he fell backwards onto his back down about 5-6 stairs. He states that he hit his right flank on concrete and landed on a corner of concrete with his right flank. He c/o pain to the right flank, left knee, and left hand. He has been ambulatory since the fall. States his pain is severe in his right flank. Pain is worse with movement. Tried taking ibuprofen with no significant relief.   He denies head trauma, LOC, or neck pain. No headaches, chest pain or shortness of breath. No numbness/weakness to extremities. Denies nausea or vomiting. No diarrhea, constipation, blood in stool, or blood in urine. Pt denies any numbness/tingling/weakness to the BLE. Denies saddle anesthesia. Denies loss of control of bowels or bladder. No urinary retention. No fevers.    History reviewed. No pertinent past medical history.  There are no active problems to display for this patient.   History reviewed. No pertinent surgical history.      Home Medications    Prior to Admission medications   Medication Sig Start Date End Date Taking? Authorizing Provider  clotrimazole (LOTRIMIN) 1 % cream Apply 1 application topically 2 (two) times daily. 11/21/17   Shade FloodGreene, Jeffrey R, MD  Crisaborole 2 % OINT Apply topically.    [provider]  fluconazole (DIFLUCAN) 150 MG tablet Take 1 tablet (150 mg total) by mouth once  a week. 11/21/17   Shade FloodGreene, Jeffrey R, MD  HYDROcodone-acetaminophen (NORCO/VICODIN) 5-325 MG tablet Take 2 tablets by mouth every 8 (eight) hours as needed. 01/07/18   Earnestene Angello S, PA-C  hydrocortisone-pramoxine (ANALPRAM HC) 2.5-1 % rectal cream Place 1 application rectally 3 (three) times daily. 04/01/15   Peyton NajjarHopper, David H, MD  ibuprofen (ADVIL,MOTRIN) 600 MG tablet Take 1 tablet (600 mg total) by mouth every 6 (six) hours as needed. 01/07/18   Heidy Mccubbin S, PA-C    Family History Family History  Problem Relation Age of Onset  . Cancer Neg Hx   . Diabetes Neg Hx   . Heart failure Neg Hx     Social History Social History   Tobacco Use  . Smoking status: Never Smoker  . Smokeless tobacco: Never Used  Substance Use Topics  . Alcohol use: Yes    Alcohol/week: 0.0 oz  . Drug use: No     Allergies   Patient has no known allergies.   Review of Systems Review of Systems  Constitutional: Negative for fever.  HENT: Negative for congestion and rhinorrhea.   Eyes: Negative for visual disturbance.  Respiratory: Negative for cough and shortness of breath.   Cardiovascular: Negative for chest pain.  Gastrointestinal: Positive for abdominal pain. Negative for diarrhea, nausea and vomiting.  Genitourinary: Positive for flank pain. Negative for hematuria.  Musculoskeletal: Positive for back pain. Negative for neck pain.  Skin: Negative for wound.  Neurological: Negative for dizziness, weakness, light-headedness, numbness and headaches.       No LOC     Physical Exam Updated Vital Signs BP 118/87 (BP Location: Left Leg)   Pulse 64   Temp 98.3 F (36.8 C) (Oral)   Resp 14   Ht 5\' 4"  (1.626 m)   Wt 70.3 kg (155 lb)   SpO2 100%   BMI 26.61 kg/m   Physical Exam  Constitutional: He appears well-developed and well-nourished.  HENT:  Head: Normocephalic and atraumatic.  Eyes: Conjunctivae are normal.  Neck: Neck supple.  Cardiovascular: Normal rate, regular rhythm,  normal heart sounds and intact distal pulses.  No murmur heard. Pulmonary/Chest: Effort normal and breath sounds normal. No stridor. No respiratory distress. He has no wheezes.  TTP to posterior right chest wall  Abdominal: Soft. Bowel sounds are normal.  Right CVA TTP. No abdominal TTP. No overlying ecchymosis or contusion.   Musculoskeletal: He exhibits no edema.  No cervical or thoracic midline TTP. Mild TTP to midline lumbar spine and right lumbar paraspinous muscles  Neurological: He is alert.  Mental Status:  Alert, thought content appropriate, able to give a coherent history. Speech fluent without evidence of aphasia. Able to follow 2 step commands without difficulty.  Cranial Nerves:  II:  Peripheral visual fields grossly normal, pupils equal, round, reactive to light III,IV, VI: ptosis not present, extra-ocular motions intact bilaterally  V,VII: smile symmetric, facial light touch sensation equal Normal tone. 5/5 strength of BUE and BLE major muscle groups including strong and equal grip strength and dorsiflexion/plantar flexion Sensory: light touch normal in all extremities. Gait: normal gait and balance.   Skin: Skin is warm and dry. Capillary refill takes less than 2 seconds.  Psychiatric: He has a normal mood and affect.  Nursing note and vitals reviewed.    ED Treatments / Results  Labs (all labs ordered are listed, but only abnormal results are displayed) Labs Reviewed  URINALYSIS, ROUTINE W REFLEX MICROSCOPIC - Abnormal; Notable for the following components:      Result Value   Color, Urine COLORLESS (*)    Specific Gravity, Urine 1.004 (*)    All other components within normal limits  CBC WITH DIFFERENTIAL/PLATELET  COMPREHENSIVE METABOLIC PANEL    EKG None  Radiology Dg Ribs Unilateral W/chest Right  Result Date: 01/07/2018 CLINICAL DATA:  Fall with rib pain EXAM: RIGHT RIBS AND CHEST - 3+ VIEW COMPARISON:  Report 03/16/2002 FINDINGS: Single-view chest  demonstrates no acute opacity or pleural effusion. Normal cardiomediastinal silhouette. No pneumothorax. Right rib series demonstrates no acute displaced right rib fracture IMPRESSION: 1. No radiographic evidence for acute cardiopulmonary abnormality. 2. No acute displaced right rib fracture Electronically Signed   By: Jasmine Pang M.D.   On: 01/07/2018 18:04   Dg Tibia/fibula Left  Result Date: 01/07/2018 CLINICAL DATA:  Fall with pain EXAM: LEFT TIBIA AND FIBULA - 2 VIEW COMPARISON:  None. FINDINGS: There is no evidence of fracture or other focal bone lesions. Soft tissues are unremarkable. IMPRESSION: Negative. Electronically Signed   By: Jasmine Pang M.D.   On: 01/07/2018 18:04   Ct Abdomen Pelvis W Contrast  Result Date: 01/07/2018 CLINICAL DATA:  Blunt abdominal trauma, RIGHT flank pain after falling onto concrete manage 3 days ago striking back EXAM: CT ABDOMEN AND PELVIS WITH CONTRAST TECHNIQUE: Multidetector CT imaging of the abdomen and pelvis was performed using the standard protocol following bolus administration of intravenous contrast. Sagittal and coronal MPR images reconstructed from  axial data set. BB placed at site of symptoms RIGHT flank. CONTRAST:  OMNIPAQUE IOHEXOL 300 MG/ML SOLN IV. No oral contrast. COMPARISON:  None FINDINGS: Lower chest: Minimal dependent atelectasis RIGHT lower lobe Hepatobiliary: Gallbladder and liver normal appearance Pancreas: Normal appearance Spleen: Normal appearance Adrenals/Urinary Tract: Adrenal glands, kidneys, ureters, and bladder normal appearance Stomach/Bowel: Normal appendix. Stomach and bowel loops normal appearance for technique. Vascular/Lymphatic: Vascular structures patent.  No adenopathy. Reproductive: Unremarkable prostate gland and seminal vesicles Other: No free air or free fluid. No hernia or acute inflammatory process. Musculoskeletal: Nondisplaced transverse processes RIGHT L2 and RIGHT L3. No additional fractures. Posterior chest  wall/abdominal wall soft tissues unremarkable. IMPRESSION: Nondisplaced transverse process fractures RIGHT L2 and RIGHT L3. No acute intra-abdominal or intrapelvic abnormalities. Electronically Signed   By: Ulyses Southward M.D.   On: 01/07/2018 19:01   Dg Hand Complete Left  Result Date: 01/07/2018 CLINICAL DATA:  Fall with medial pain EXAM: LEFT HAND - COMPLETE 3+ VIEW COMPARISON:  None. FINDINGS: There is no evidence of fracture or dislocation. There is no evidence of arthropathy or other focal bone abnormality. Soft tissues are unremarkable. IMPRESSION: Negative. Electronically Signed   By: Jasmine Pang M.D.   On: 01/07/2018 18:02    Procedures Procedures (including critical care time)  Medications Ordered in ED Medications  iohexol (OMNIPAQUE) 300 MG/ML solution 100 mL (100 mLs Intravenous Contrast Given 01/07/18 1838)     Initial Impression / Assessment and Plan / ED Course  I have reviewed the triage vital signs and the nursing notes.  Pertinent labs & imaging results that were available during my care of the patient were reviewed by me and considered in my medical decision making (see chart for details).   Discussed case with Dr. Adriana Simas who agrees with the plan to give patient symptomatic treatment and have patient follow-up as an outpatient.    Final Clinical Impressions(s) / ED Diagnoses   Final diagnoses:  Closed fracture of transverse process of lumbar vertebra, initial encounter [facility])  Fall, initial encounter   Patient presenting after a fall with right flank pain.  Vital signs stable.  Afebrile.  On exam is tenderness in the lumbar spine and right sided paraspinous muscles of the lumbar spine also with right CVA tenderness.  No overlying ecchymosis or contusion to the abdomen or flank.  No red flag signs or symptoms.  Chest x-ray and right rib x-ray were negative for rib fracture.  Left hand x-ray negative.  Left tib-fib fracture negative.  His labs are benign.  UA is negative and  without any hematuria.  CT of the abdomen pelvis did not show any acute injury to the organs of the abdomen and pelvis.  Did show transverse process fractures to the right L2 and L3 that are nondisplaced.  Discussed the findings with the patient and wife at bedside.  Offered TLSO brace and patient declined.  He is requesting pain medications.  We will discharge him with pain medications.  Will give referral to neurosurgery for follow-up.  Discussed red flag signs and symptoms and advised him to return to the ER if he experiences any of these or has any new or worsening symptoms.  Advised him to follow-up with neurosurgery in 1 to 2 weeks for reevaluation if he continues to have symptoms.  Patient voices an understanding of the plan for follow-up and reasons to return immediately to the ED.  All questions were answered and patient is stable for discharge.  ED Discharge Orders  Ordered    ibuprofen (ADVIL,MOTRIN) 600 MG tablet  Every 6 hours PRN     01/07/18 2014    HYDROcodone-acetaminophen (NORCO/VICODIN) 5-325 MG tablet  Every 8 hours PRN     01/07/18 2014       Karrie Meres, PA-C 01/07/18 2020    Donnetta Hutching, MD 01/08/18 [phone]

## 2018-01-07 NOTE — ED Notes (Signed)
Pt transported to CT ?

## 2018-01-07 NOTE — Discharge Instructions (Addendum)
You were given a prescription for ibuprofen which is an anti-inflammatory.  You may take 600 mg of ibuprofen every 6 hours as needed for pain.  You were also given a prescription for hydrocodone.  If you have breakthrough pain you may take 1 hydrocodone every 8 hours.  Do not drive, work, Advertising copywriteroperate machinery or drink alcohol while you are taking hydrocodone as it can make you very drowsy.  Hydrocodone can also make you constipated, if you experience constipation please take MiraLAX once daily.  Please follow-up with the neurosurgery and if you continue to have pain.  Please return to the ER if you have any numbness or weakness to your legs, loss of control of your bowels or your bladder, Urinary retention, inability to walk, weakness in your legs, symptoms.

## 2018-01-07 NOTE — ED Notes (Signed)
Patient able to ambulate independently  

## 2018-01-07 NOTE — ED Notes (Signed)
Patient at bedside updating patient

## 2018-01-07 NOTE — ED Notes (Signed)
PA at bedside with interpreter pad 

## 2019-04-23 ENCOUNTER — Ambulatory Visit: Payer: Self-pay | Admitting: Emergency Medicine

## 2019-04-24 ENCOUNTER — Encounter: Payer: Self-pay | Admitting: Emergency Medicine

## 2019-06-18 ENCOUNTER — Encounter (HOSPITAL_COMMUNITY): Payer: Self-pay

## 2019-06-18 ENCOUNTER — Other Ambulatory Visit: Payer: Self-pay

## 2019-06-18 ENCOUNTER — Ambulatory Visit (HOSPITAL_COMMUNITY)
Admission: EM | Admit: 2019-06-18 | Discharge: 2019-06-18 | Disposition: A | Discharge status: Home / Self Care | Payer: Self-pay | Attending: Internal Medicine | Admitting: Internal Medicine

## 2019-06-18 DIAGNOSIS — B356 Tinea cruris: Secondary | ICD-10-CM

## 2019-06-18 DIAGNOSIS — B354 Tinea corporis: Secondary | ICD-10-CM

## 2019-06-18 MED ORDER — TERBINAFINE HCL 250 MG PO TABS: 250.0000 mg | ORAL_TABLET | Freq: Every day | ORAL | 0 refills | Status: DC

## 2019-06-18 NOTE — ED Provider Notes (Signed)
Brandon Irwin    CSN: [id number] Arrival date & time: 06/18/19  1800      History   Chief Complaint Chief Complaint  Patient presents with  . Rash    HPI Brandon Irwin is a 38 y.o. male with no past medical history comes to urgent care with complaints of rash or both legs, groin and hands as well as left eyelid.  Rash started over a month ago.  He has been using over-the-counter creams with no improvement.  The rash is pruritic.  No erythema.  Rash seems to have progressed.  He denies any sick contacts.   HPI  History reviewed. No pertinent past medical history.  There are no problems to display for this patient.   History reviewed. No pertinent surgical history.     Home Medications    Prior to Admission medications   Medication Sig Start Date End Date Taking? Authorizing Provider  Crisaborole 2 % OINT Apply topically.    [provider]  HYDROcodone-acetaminophen (NORCO/VICODIN) 5-325 MG tablet Take 2 tablets by mouth every 8 (eight) hours as needed. 01/07/18   Couture, Cortni S, PA-C  hydrocortisone-pramoxine (ANALPRAM HC) 2.5-1 % rectal cream Place 1 application rectally 3 (three) times daily. 04/01/15   Posey Boyer, MD  ibuprofen (ADVIL,MOTRIN) 600 MG tablet Take 1 tablet (600 mg total) by mouth every 6 (six) hours as needed. 01/07/18   Couture, Cortni S, PA-C  terbinafine (LAMISIL) 250 MG tablet Take 1 tablet (250 mg total) by mouth daily for 14 days. 06/18/19 07/02/19  Chase Picket, MD    Family History Family History  Problem Relation Age of Onset  . Cancer Neg Hx   . Diabetes Neg Hx   . Heart failure Neg Hx     Social History Social History   Tobacco Use  . Smoking status: Never Smoker  . Smokeless tobacco: Never Used  Substance Use Topics  . Alcohol use: Yes    Alcohol/week: 0.0 standard drinks  . Drug use: No     Allergies   Patient has no known allergies.   Review of Systems Review of Systems  Constitutional:  Negative.   HENT: Negative.   Eyes: Negative.   Gastrointestinal: Negative for diarrhea, nausea and vomiting.  Genitourinary: Negative.  Negative for dysuria, frequency and urgency.  Musculoskeletal: Negative for arthralgias and myalgias.  Skin: Positive for rash. Negative for color change and wound.  Neurological: Negative for dizziness, light-headedness, numbness and headaches.     Physical Exam Triage Vital Signs ED Triage Vitals  Enc Vitals Group     BP 06/18/19 1841 (!) 142/77     Pulse Rate 06/18/19 1841 65     Resp 06/18/19 1841 18     Temp 06/18/19 1841 98.6 F (37 C)     Temp Source 06/18/19 1841 Oral     SpO2 06/18/19 1841 100 %     Weight --      Height --      Head Circumference --      Peak Flow --      Pain Score 06/18/19 1842 0     Pain Loc --      Pain Edu? --      Excl. in [place]? --    No data found.  Updated Vital Signs BP (!) 142/77 (BP Location: Right Arm)   Pulse 65   Temp 98.6 F (37 C) (Oral)   Resp 18   SpO2 100%   Visual Acuity  Right Eye Distance:   Left Eye Distance:   Bilateral Distance:    Right Eye Near:   Left Eye Near:    Bilateral Near:     Physical Exam Constitutional:      General: He is not in acute distress.    Appearance: He is not ill-appearing.  Cardiovascular:     Rate and Rhythm: Normal rate and regular rhythm.     Pulses: Normal pulses.     Heart sounds: Normal heart sounds.  Pulmonary:     Effort: Pulmonary effort is normal.     Breath sounds: Normal breath sounds.  Abdominal:     General: Bowel sounds are normal. There is no distension.     Palpations: Abdomen is soft.     Tenderness: There is no guarding or rebound.  Musculoskeletal:        General: No swelling or signs of injury. Normal range of motion.  Skin:    Capillary Refill: Capillary refill takes less than 2 seconds.     Findings: Rash present.     Comments: Circular rash with central clearing over multiple sites over the upper extremities, lower  extremities.  Groin rash is without erythema.  Left upper eyelid rash is without erythema or swelling.  Neurological:     General: No focal deficit present.     Mental Status: He is alert and oriented to person, place, and time.      UC Treatments / Results  Labs (all labs ordered are listed, but only abnormal results are displayed) Labs Reviewed - No data to display  EKG   Radiology No results found.  Procedures Procedures (including critical care time)  Medications Ordered in UC Medications - No data to display  Initial Impression / Assessment and Plan / UC Course  I have reviewed the triage vital signs and the nursing notes.  Pertinent labs & imaging results that were available during my care of the patient were reviewed by me and considered in my medical decision making (see chart for details).     1.  Tinea corporis, failed topical agents: Lamisil 250 mg orally daily for 14 days Mild skin moisturizer If symptoms worsens patient is advised to return to urgent care to be reevaluated. Final Clinical Impressions(s) / UC Diagnoses   Final diagnoses:  Tinea corporis  Tinea cruris   Discharge Instructions   None    ED Prescriptions    Medication Sig Dispense Auth. Provider   terbinafine (LAMISIL) 250 MG tablet Take 1 tablet (250 mg total) by mouth daily for 14 days. 14 tablet Noor Witte, Britta Mccreedy, MD     PDMP not reviewed this encounter.   Merrilee Jansky, MD 06/20/19 1227

## 2019-06-18 NOTE — ED Triage Notes (Signed)
Pt present a rash in his foot, arm and hand. The rash has spread, this been going on for over month.

## 2019-06-28 ENCOUNTER — Other Ambulatory Visit: Payer: Self-pay

## 2019-06-28 ENCOUNTER — Ambulatory Visit (HOSPITAL_COMMUNITY)
Admission: EM | Admit: 2019-06-28 | Discharge: 2019-06-28 | Disposition: A | Discharge status: Home / Self Care | Payer: Self-pay | Attending: Family Medicine | Admitting: Family Medicine

## 2019-06-28 ENCOUNTER — Encounter (HOSPITAL_COMMUNITY): Payer: Self-pay

## 2019-06-28 DIAGNOSIS — L308 Other specified dermatitis: Secondary | ICD-10-CM

## 2019-06-28 MED ORDER — TRIAMCINOLONE ACETONIDE 0.1 % EX CREA: 1.0000 "application " | TOPICAL_CREAM | Freq: Two times a day (BID) | CUTANEOUS | 1 refills | Status: DC

## 2019-06-28 MED ORDER — PREDNISONE 20 MG PO TABS: ORAL_TABLET | ORAL | 1 refills | Status: DC

## 2019-06-28 NOTE — ED Provider Notes (Signed)
[facility]    CSN: [id number] Arrival date & time: 06/28/19  5625      History   Chief Complaint Chief Complaint  Patient presents with  . Rash    HPI Brandon Irwin is a 38 y.o. male.   Established [facility] patient  Pt presents to the UC with itchy rash in legs, arms, groin area and eyelids and forehead x 1 month. Pt reports he is using terbinafide without relief. Pt states he feel SOB when he takes the terbinafide.     History reviewed. No pertinent past medical history.  There are no problems to display for this patient.   History reviewed. No pertinent surgical history.     Home Medications    Prior to Admission medications   Medication Sig Start Date End Date Taking? Authorizing Provider  predniSONE (DELTASONE) 20 MG tablet Two daily with food 06/28/19   Elvina Sidle, MD  triamcinolone cream (KENALOG) 0.1 % Apply 1 application topically 2 (two) times daily. 06/28/19   Elvina Sidle, MD    Family History Family History  Problem Relation Age of Onset  . Cancer Neg Hx   . Diabetes Neg Hx   . Heart failure Neg Hx     Social History Social History   Tobacco Use  . Smoking status: Never Smoker  . Smokeless tobacco: Never Used  Substance Use Topics  . Alcohol use: Yes    Alcohol/week: 0.0 standard drinks  . Drug use: No     Allergies   Patient has no known allergies.   Review of Systems Review of Systems   Physical Exam Triage Vital Signs ED Triage Vitals  Enc Vitals Group     BP 06/28/19 1004 115/80     Pulse Rate 06/28/19 1004 66     Resp 06/28/19 1004 16     Temp 06/28/19 1004 98.7 F (37.1 C)     Temp Source 06/28/19 1004 Oral     SpO2 06/28/19 1004 100 %     Weight --      Height --      Head Circumference --      Peak Flow --      Pain Score 06/28/19 1002 0     Pain Loc --      Pain Edu? --      Excl. in [place]? --    No data found.  Updated Vital Signs BP 115/80 (BP Location: Left Arm)   Pulse 66   Temp  98.7 F (37.1 C) (Oral)   Resp 16   SpO2 100%    Physical Exam Vitals and nursing note reviewed.  Constitutional:      Appearance: Normal appearance. He is normal weight.  Cardiovascular:     Rate and Rhythm: Normal rate.  Pulmonary:     Effort: Pulmonary effort is normal.  Musculoskeletal:     Cervical back: Normal range of motion and neck supple.  Skin:    General: Skin is warm and dry.     Findings: Rash present.  Neurological:     General: No focal deficit present.     Mental Status: He is alert.        UC Treatments / Results  Labs (all labs ordered are listed, but only abnormal results are displayed) Labs Reviewed - No data to display  EKG   Radiology No results found.  Procedures Procedures (including critical care time)  Medications Ordered in UC Medications - No data to display  Initial  Impression / Assessment and Plan / UC Course  I have reviewed the triage vital signs and the nursing notes.  Pertinent labs & imaging results that were available during my care of the patient were reviewed by me and considered in my medical decision making (see chart for details).    Final Clinical Impressions(s) / UC Diagnoses   Final diagnoses:  Other eczema   Discharge Instructions   None    ED Prescriptions    Medication Sig Dispense Auth. Provider   predniSONE (DELTASONE) 20 MG tablet Two daily with food 10 tablet Robyn Haber, MD   triamcinolone cream (KENALOG) 0.1 % Apply 1 application topically 2 (two) times daily. 80 g Robyn Haber, MD     I have reviewed the PDMP during this encounter.   Robyn Haber, MD 06/28/19 1032

## 2019-06-28 NOTE — ED Triage Notes (Signed)
Pt presents to the UC with itchy rash in legs, arms, groin area and eyelids and forehead x 1 month. Pt reports he is using terbinafide without relief. Pt states he feel SOB when he takes the terbinafide.

## 2019-07-02 ENCOUNTER — Ambulatory Visit: Payer: Self-pay | Admitting: Family Medicine

## 2019-10-01 ENCOUNTER — Ambulatory Visit: Payer: Self-pay | Attending: Internal Medicine

## 2019-10-01 DIAGNOSIS — Z23 Encounter for immunization: Secondary | ICD-10-CM

## 2019-10-01 NOTE — Progress Notes (Signed)
   Covid-19 Vaccination Clinic  Name:  Brandon Irwin    MRN: [id number] DOB: 06-20-1981  10/01/2019  [name] was observed post Covid-19 immunization for 15 minutes without incident. He was provided with Vaccine Information Sheet and instruction to access the V-Safe system.   [name] was instructed to call 911 with any severe reactions post vaccine: Marland Kitchen Difficulty breathing  . Swelling of face and throat  . A fast heartbeat  . A bad rash all over body  . Dizziness and weakness   Immunizations Administered    Name Date Dose VIS Date Route   Pfizer COVID-19 Vaccine 10/01/2019  4:43 PM 0.3 mL 06/15/2019 Intramuscular   Manufacturer: [employer], [employer]   Lot: [id number]   NDC: [address]-4

## 2019-10-24 ENCOUNTER — Ambulatory Visit: Payer: Self-pay

## 2019-10-26 ENCOUNTER — Ambulatory Visit: Payer: Self-pay | Attending: Internal Medicine

## 2019-10-26 DIAGNOSIS — Z23 Encounter for immunization: Secondary | ICD-10-CM

## 2019-10-26 NOTE — Progress Notes (Signed)
   Covid-19 Vaccination Clinic  Name:  Brandon Irwin    MRN: [id number] DOB: 1980-08-11  10/26/2019  Mr. Beach was observed post Covid-19 immunization for 15 minutes without incident. He was provided with Vaccine Information Sheet and instruction to access the V-Safe system.   Mr. Filter was instructed to call 911 with any severe reactions post vaccine: Marland Kitchen Difficulty breathing  . Swelling of face and throat  . A fast heartbeat  . A bad rash all over body  . Dizziness and weakness   Immunizations Administered    Name Date Dose VIS Date Route   Pfizer COVID-19 Vaccine 10/26/2019  2:49 PM 0.3 mL 08/29/2018 Intramuscular   Manufacturer: [employer], [employer]   Lot: [id number]   NDC: [address]-2

## 2019-12-04 ENCOUNTER — Emergency Department (HOSPITAL_COMMUNITY)
Admission: EM | Admit: 2019-12-04 | Discharge: 2019-12-05 | Disposition: A | Discharge status: Home / Self Care | Payer: Self-pay | Attending: Emergency Medicine | Admitting: Emergency Medicine

## 2019-12-04 ENCOUNTER — Emergency Department (HOSPITAL_COMMUNITY): Payer: Self-pay

## 2019-12-04 DIAGNOSIS — Y929 Unspecified place or not applicable: Secondary | ICD-10-CM | POA: Insufficient documentation

## 2019-12-04 DIAGNOSIS — Y9301 Activity, walking, marching and hiking: Secondary | ICD-10-CM | POA: Insufficient documentation

## 2019-12-04 DIAGNOSIS — X500XXA Overexertion from strenuous movement or load, initial encounter: Secondary | ICD-10-CM | POA: Insufficient documentation

## 2019-12-04 DIAGNOSIS — Y999 Unspecified external cause status: Secondary | ICD-10-CM | POA: Insufficient documentation

## 2019-12-04 DIAGNOSIS — S93492A Sprain of other ligament of left ankle, initial encounter: Secondary | ICD-10-CM | POA: Insufficient documentation

## 2019-12-04 NOTE — ED Triage Notes (Signed)
Patient in POV. Reports L ankle pain after falling down steps. Denies hitting head, denies LOC. Ambulatory.

## 2019-12-05 MED ORDER — IBUPROFEN 400 MG PO TABS
600.0000 mg | ORAL_TABLET | Freq: Once | ORAL | Status: AC
  Administered 2019-12-05: 600 mg via ORAL
  Filled 2019-12-05: qty 1

## 2019-12-05 NOTE — Discharge Instructions (Addendum)
Your x-ray showed no fracture.  You have an ankle sprain.  Wear brace and use crutches.  Take ibuprofen and Tylenol as needed for pain.  Ice and elevate the ankle.  Follow-up with sports medicine if pain is not improving.

## 2019-12-05 NOTE — ED Notes (Signed)
ED Provider at bedside. 

## 2019-12-05 NOTE — ED Provider Notes (Signed)
[facility] EMERGENCY DEPARTMENT Provider Note   CSN: [id number] Arrival date & time: 12/04/19  2037     History Chief Complaint  Patient presents with  . Ankle Pain    Brandon Irwin is a 39 y.o. male.  Brandon Irwin is a 39 y.o. male who is otherwise healthy, presents to the ED for evaluation of left ankle pain.  He reports that around 4:00 this afternoon he was walking down the stairs when his left ankle rolled causing him to fall down, he denies other injuries from fall aside from ankle pain.  He states that he was able to get up and walk but is very painful to put weight on the ankle.  He has noted some swelling over the lateral malleolus.  No bruising or wounds.  No numbness tingling or weakness.  No prior injury or surgery.  No meds prior to arrival.  No other aggravating or alleviating factors.        No past medical history on file.  There are no problems to display for this patient.   No past surgical history on file.     Family History  Problem Relation Age of Onset  . Cancer Neg Hx   . Diabetes Neg Hx   . Heart failure Neg Hx     Social History   Tobacco Use  . Smoking status: Never Smoker  . Smokeless tobacco: Never Used  Substance Use Topics  . Alcohol use: Yes    Alcohol/week: 0.0 standard drinks  . Drug use: No    Home Medications Prior to Admission medications   Medication Sig Start Date End Date Taking? Authorizing Provider  predniSONE (DELTASONE) 20 MG tablet Two daily with food 06/28/19   Robyn Haber, MD  triamcinolone cream (KENALOG) 0.1 % Apply 1 application topically 2 (two) times daily. 06/28/19   Robyn Haber, MD    Allergies    Patient has no known allergies.  Review of Systems   Review of Systems  Constitutional: Negative for chills and fever.  Musculoskeletal: Positive for arthralgias and joint swelling.  Skin: Negative for color change, rash and wound.  Neurological: Negative for weakness and  numbness.    Physical Exam Updated Vital Signs BP 130/69 (BP Location: Left Arm)   Pulse 91   Temp 98.3 F (36.8 C) (Oral)   Resp 16   SpO2 100%   Physical Exam Vitals and nursing note reviewed.  Constitutional:      General: He is not in acute distress.    Appearance: Normal appearance. He is well-developed. He is not ill-appearing or diaphoretic.  HENT:     Head: Normocephalic and atraumatic.  Eyes:     General:        Right eye: No discharge.        Left eye: No discharge.  Pulmonary:     Effort: Pulmonary effort is normal. No respiratory distress.  Musculoskeletal:     Comments: There is swelling and tenderness over the lateral malleolus.No overt deformity. No tenderness over the medial aspect of the ankle. The fifth metatarsal is not tender. The ankle joint is intact without excessive opening on stressing.  2+ DP and TP pulses, able to wiggle toes, normal sensation and strength  Skin:    General: Skin is warm and dry.  Neurological:     Mental Status: He is alert.     Coordination: Coordination normal.  Psychiatric:        Behavior: Behavior normal.  ED Results / Procedures / Treatments   Labs (all labs ordered are listed, but only abnormal results are displayed) Labs Reviewed - No data to display  EKG None  Radiology DG Ankle Complete Left  Result Date: 12/04/2019 CLINICAL DATA:  Left ankle pain EXAM: LEFT ANKLE COMPLETE - 3+ VIEW COMPARISON:  None. FINDINGS: There is no evidence of fracture, dislocation, or joint effusion. There is no evidence of arthropathy or other focal bone abnormality. Soft tissues are unremarkable. IMPRESSION: Negative. Electronically Signed   By: Charlett Nose M.D.   On: 12/04/2019 21:23    Procedures Procedures (including critical care time)  Medications Ordered in ED Medications  ibuprofen (ADVIL) tablet 600 mg (has no administration in time range)    ED Course  I have reviewed the triage vital signs and the nursing  notes.  Pertinent labs & imaging results that were available during my care of the patient were reviewed by me and considered in my medical decision making (see chart for details).    MDM Rules/Calculators/A&P                     Presentation consistent with ankle sprain. Tenderness and swelling over lateral malleolus, pt is neurovascularly intact, and x-ray negative for fracture, and shows ankle mortise is intact. Pain treated in the ED. Pt placed in ASO brace and provided crutches, ambulated without difficulty. Pt stable for discharge home with ibuprofen for pain. Pt to follow-up with ortho in one week if symptoms not improving. Return precautions discussed, Pt expresses understanding and agrees with plan.  Final Clinical Impression(s) / ED Diagnoses Final diagnoses:  Sprain of anterior talofibular ligament of left ankle, initial encounter    Rx / DC Orders ED Discharge Orders    None       Dartha Lodge, PA-C 12/05/19 0100    Ward, Layla Maw, DO 12/05/19 0129

## 2019-12-05 NOTE — ED Notes (Signed)
Pt called for room x3  

## 2019-12-05 NOTE — ED Notes (Signed)
Interpreter at bedside.

## 2019-12-05 NOTE — ED Notes (Signed)
Pt instructed on how to use crutches, pt voiced understanding on how to use.

## 2021-02-16 ENCOUNTER — Other Ambulatory Visit: Payer: Self-pay

## 2021-02-16 ENCOUNTER — Ambulatory Visit (HOSPITAL_COMMUNITY)
Admission: EM | Admit: 2021-02-16 | Discharge: 2021-02-16 | Disposition: A | Discharge status: Home / Self Care | Payer: Self-pay | Attending: Internal Medicine | Admitting: Internal Medicine

## 2021-02-16 ENCOUNTER — Encounter (HOSPITAL_COMMUNITY): Payer: Self-pay

## 2021-02-16 DIAGNOSIS — B356 Tinea cruris: Secondary | ICD-10-CM

## 2021-02-16 MED ORDER — MICONAZOLE NITRATE 2 % EX CREA: 1.0000 "application " | TOPICAL_CREAM | Freq: Two times a day (BID) | CUTANEOUS | 0 refills | Status: DC

## 2021-02-16 MED ORDER — TERBINAFINE HCL 250 MG PO TABS: 250.0000 mg | ORAL_TABLET | Freq: Every day | ORAL | 0 refills | Status: AC

## 2021-02-16 NOTE — Discharge Instructions (Addendum)
Apply medication to the affected area Take medications as prescribed If you have worsening symptoms please return to urgent care to be reevaluated.

## 2021-02-16 NOTE — ED Provider Notes (Signed)
[facility]    CSN: [id number] Arrival date & time: 02/16/21  1619      History   Chief Complaint Chief Complaint  Patient presents with   Rash    HPI Brandon Irwin is a 40 y.o. male comes to the urgent care with 1 month history of rash in the groin area and left anterior abdominal wall.  The rash started a month ago in the groin area and has been progressively worse.  The rash is pruritic, red without any weeping or vesicles.  No fever or chills.  Patient had a similar episodes last year.  He was treated with a course of antifungal agent with good results.  He works as a Agricultural engineer.   HPI  History reviewed. No pertinent past medical history.  There are no problems to display for this patient.   History reviewed. No pertinent surgical history.     Home Medications    Prior to Admission medications   Medication Sig Start Date End Date Taking? Authorizing Provider  miconazole (MICOTIN) 2 % cream Apply 1 application topically 2 (two) times daily. 02/16/21  Yes Percy Winterrowd, Britta Mccreedy, MD  terbinafine (LAMISIL) 250 MG tablet Take 1 tablet (250 mg total) by mouth daily for 10 days. 02/16/21 02/26/21 Yes Jossalyn Forgione, Britta Mccreedy, MD    Family History Family History  Problem Relation Age of Onset   Cancer Neg Hx    Diabetes Neg Hx    Heart failure Neg Hx     Social History Social History   Tobacco Use   Smoking status: Never   Smokeless tobacco: Never  Substance Use Topics   Alcohol use: Yes    Alcohol/week: 0.0 standard drinks   Drug use: No     Allergies   Patient has no known allergies.   Review of Systems Review of Systems  Constitutional: Negative.   Gastrointestinal: Negative.   Genitourinary: Negative.   Musculoskeletal: Negative.   Skin:  Positive for color change and rash. Negative for wound.    Physical Exam Triage Vital Signs ED Triage Vitals  Enc Vitals Group     BP 02/16/21 1713 133/90     Pulse Rate 02/16/21 1713 64     Resp 02/16/21  1713 18     Temp 02/16/21 1713 97.6 F (36.4 C)     Temp Source 02/16/21 1713 Oral     SpO2 02/16/21 1713 98 %     Weight --      Height --      Head Circumference --      Peak Flow --      Pain Score 02/16/21 1710 0     Pain Loc --      Pain Edu? --      Excl. in [place]? --    No data found.  Updated Vital Signs BP 133/90 (BP Location: Left Arm)   Pulse 64   Temp 97.6 F (36.4 C) (Oral)   Resp 18   SpO2 98%   Visual Acuity Right Eye Distance:   Left Eye Distance:   Bilateral Distance:    Right Eye Near:   Left Eye Near:    Bilateral Near:     Physical Exam Vitals and nursing note reviewed.  Constitutional:      General: He is not in acute distress.    Appearance: He is not ill-appearing.  Cardiovascular:     Rate and Rhythm: Normal rate and regular rhythm.  Musculoskeletal:  General: Normal range of motion.  Skin:    General: Skin is warm.     Findings: Erythema and rash present.  Neurological:     Mental Status: He is alert.     UC Treatments / Results  Labs (all labs ordered are listed, but only abnormal results are displayed) Labs Reviewed - No data to display  EKG   Radiology No results found.  Procedures Procedures (including critical care time)  Medications Ordered in UC Medications - No data to display  Initial Impression / Assessment and Plan / UC Course  I have reviewed the triage vital signs and the nursing notes.  Pertinent labs & imaging results that were available during my care of the patient were reviewed by me and considered in my medical decision making (see chart for details).     1.  Tinea cruris: Miconazole powder Lamisil 250 mg daily for 10 days If symptoms worsen patient is advised to return to urgent care to be reevaluated. Final Clinical Impressions(s) / UC Diagnoses   Final diagnoses:  Tinea cruris     Discharge Instructions      Apply medication to the affected area Take medications as prescribed If  you have worsening symptoms please return to urgent care to be reevaluated.   ED Prescriptions     Medication Sig Dispense Auth. Provider   miconazole (MICOTIN) 2 % cream Apply 1 application topically 2 (two) times daily. 28.35 g Merrilee Jansky, MD   terbinafine (LAMISIL) 250 MG tablet Take 1 tablet (250 mg total) by mouth daily for 10 days. 10 tablet Vera Furniss, Britta Mccreedy, MD      PDMP not reviewed this encounter.   Merrilee Jansky, MD 02/16/21 [phone]

## 2021-02-16 NOTE — ED Triage Notes (Signed)
Pt c/o rash that is on abd, and bilateral legs, states they itch and has been going on for a month. Pt states this happened last year as well.

## 2021-06-01 ENCOUNTER — Other Ambulatory Visit: Payer: Self-pay

## 2021-06-01 ENCOUNTER — Encounter (HOSPITAL_COMMUNITY): Payer: Self-pay

## 2021-06-01 ENCOUNTER — Ambulatory Visit (HOSPITAL_COMMUNITY)
Admission: EM | Admit: 2021-06-01 | Discharge: 2021-06-01 | Disposition: A | Discharge status: Home / Self Care | Payer: Self-pay

## 2021-06-01 DIAGNOSIS — Z0130 Encounter for examination of blood pressure without abnormal findings: Secondary | ICD-10-CM

## 2021-06-01 NOTE — ED Triage Notes (Signed)
Pt reports concerns of HBP. States he was at a dental appointment and was told his BP was high. States he checked it again at home and states it was high.   States he felt nervous this morning and states he had  R side chest pain. States he was diagnosed with pre-diabetes and states his blood sugar is now regulated.   Denies pain at moment.

## 2021-06-01 NOTE — Discharge Instructions (Addendum)
-  Please check your blood pressure at home or at the pharmacy. If this continues to be >140/90, follow-up with your primary care provider for further blood pressure management/ medication titration. If you develop chest pain, shortness of breath, vision changes, the worst headache of your life- head straight to the ED or call 911. -Follow-up with your PCP as scheduled on 12/9. This is where you should discuss your chronic medical conditions.

## 2021-06-01 NOTE — ED Provider Notes (Signed)
[facility]    CSN: [id number] Arrival date & time: 06/01/21  1305      History   Chief Complaint Chief Complaint  Patient presents with   Hypertension    HPI Brandon Irwin is a 40 y.o. male presenting for BP check. Medical history noncontributory. Describes BP of 142/92 at dentist 1 week ago, then checked this at home (using wrist cuff) and it was 145/111. Brief episode of right sided chest pain upon waking this morning, this resolved on its own after few minutes.  Denies any left-sided chest pain, dizziness, shortness of breath, weakness, headaches.  He has a visit with his primary care doctor in 1 week on 12/9, they follow him for chronic conditions.  HPI  History reviewed. No pertinent past medical history.  There are no problems to display for this patient.   History reviewed. No pertinent surgical history.     Home Medications    Prior to Admission medications   Medication Sig Start Date End Date Taking? Authorizing Provider  miconazole (MICOTIN) 2 % cream Apply 1 application topically 2 (two) times daily. 02/16/21   LampteyBritta Mccreedy, MD    Family History Family History  Problem Relation Age of Onset   Cancer Neg Hx    Diabetes Neg Hx    Heart failure Neg Hx     Social History Social History   Tobacco Use   Smoking status: Never   Smokeless tobacco: Never  Substance Use Topics   Alcohol use: Yes    Alcohol/week: 0.0 standard drinks   Drug use: No     Allergies   Patient has no known allergies.   Review of Systems Review of Systems  Respiratory:  Negative for shortness of breath.   Cardiovascular:  Negative for chest pain and palpitations.  All other systems reviewed and are negative.   Physical Exam Triage Vital Signs ED Triage Vitals  Enc Vitals Group     BP 06/01/21 1457 134/89     Pulse Rate 06/01/21 1457 65     Resp 06/01/21 1457 19     Temp 06/01/21 1457 98.3 F (36.8 C)     Temp Source 06/01/21 1457 Oral     SpO2  06/01/21 1457 97 %     Weight --      Height --      Head Circumference --      Peak Flow --      Pain Score 06/01/21 1456 0     Pain Loc --      Pain Edu? --      Excl. in GC? --    No data found.  Updated Vital Signs BP 134/89 (BP Location: Left Arm)   Pulse 65   Temp 98.3 F (36.8 C) (Oral)   Resp 19   SpO2 97%   Visual Acuity Right Eye Distance:   Left Eye Distance:   Bilateral Distance:    Right Eye Near:   Left Eye Near:    Bilateral Near:     Physical Exam Vitals reviewed.  Constitutional:      Appearance: Normal appearance. He is not diaphoretic.  HENT:     Head: Normocephalic and atraumatic.     Mouth/Throat:     Mouth: Mucous membranes are moist.  Eyes:     Extraocular Movements: Extraocular movements intact.     Pupils: Pupils are equal, round, and reactive to light.  Cardiovascular:     Rate and Rhythm: Normal rate and  regular rhythm.     Pulses:          Radial pulses are 2+ on the right side and 2+ on the left side.     Heart sounds: Normal heart sounds.  Pulmonary:     Effort: Pulmonary effort is normal.     Breath sounds: Normal breath sounds.  Abdominal:     Palpations: Abdomen is soft.     Tenderness: There is no abdominal tenderness. There is no guarding or rebound.  Musculoskeletal:     Right lower leg: No edema.     Left lower leg: No edema.  Skin:    General: Skin is warm.     Capillary Refill: Capillary refill takes less than 2 seconds.  Neurological:     General: No focal deficit present.     Mental Status: He is alert and oriented to person, place, and time.  Psychiatric:        Mood and Affect: Mood normal.        Behavior: Behavior normal.        Thought Content: Thought content normal.        Judgment: Judgment normal.     UC Treatments / Results  Labs (all labs ordered are listed, but only abnormal results are displayed) Labs Reviewed - No data to display  EKG   Radiology No results  found.  Procedures Procedures (including critical care time)  Medications Ordered in UC Medications - No data to display  Initial Impression / Assessment and Plan / UC Course  I have reviewed the triage vital signs and the nursing notes.  Pertinent labs & imaging results that were available during my care of the patient were reviewed by me and considered in my medical decision making (see chart for details).     This patient is a very pleasant 40 y.o. year old male presenting for BP check. BP is in normal range. He has a physical scheduled in 1 week (12/9), follow-up with them for chronic conditions/ BP monitoring. Advised checking this at home and purchasing an arm cuff instead of a wrist cuff. ED return precautions discussed. Patient verbalizes understanding and agreement.  .   Final Clinical Impressions(s) / UC Diagnoses   Final diagnoses:  Blood pressure check     Discharge Instructions      -Please check your blood pressure at home or at the pharmacy. If this continues to be >140/90, follow-up with your primary care provider for further blood pressure management/ medication titration. If you develop chest pain, shortness of breath, vision changes, the worst headache of your life- head straight to the ED or call 911. -Follow-up with your PCP as scheduled on 12/9. This is where you should discuss your chronic medical conditions.   ED Prescriptions   None    PDMP not reviewed this encounter.   Rhys Martini, PA-C 06/01/21 1514

## 2021-06-25 ENCOUNTER — Encounter (HOSPITAL_COMMUNITY): Payer: Self-pay | Admitting: *Deleted

## 2021-06-25 ENCOUNTER — Emergency Department (HOSPITAL_COMMUNITY)
Admission: EM | Admit: 2021-06-25 | Discharge: 2021-06-25 | Disposition: A | Discharge status: Home / Self Care | Payer: Self-pay | Attending: Emergency Medicine | Admitting: Emergency Medicine

## 2021-06-25 ENCOUNTER — Emergency Department (HOSPITAL_COMMUNITY): Payer: Self-pay

## 2021-06-25 ENCOUNTER — Other Ambulatory Visit: Payer: Self-pay

## 2021-06-25 DIAGNOSIS — W19XXXA Unspecified fall, initial encounter: Secondary | ICD-10-CM

## 2021-06-25 DIAGNOSIS — W108XXA Fall (on) (from) other stairs and steps, initial encounter: Secondary | ICD-10-CM | POA: Insufficient documentation

## 2021-06-25 DIAGNOSIS — S0990XA Unspecified injury of head, initial encounter: Secondary | ICD-10-CM | POA: Insufficient documentation

## 2021-06-25 NOTE — ED Provider Notes (Signed)
[facility] EMERGENCY DEPARTMENT Provider Note   CSN: [id number] Arrival date & time: 06/25/21  1340     History Chief Complaint  Patient presents with   Fall    Brandon Irwin is a 39 y.o. male presents to the emergency department after suffering a fall and head injury.  Patient states that 8 days prior he slipped and fell while cleaning his deck.  Patient endorses hitting the right side of his head on the handrail and falling down a flight of wooden steps.  Patient endorsed loss of consciousness after this event.  Patient states that he has had pain to the right side of his head since then.  Patient rates the pain 6/10 on the pain scale.  Pain is worse with touch.  Patient has not tried any modalities to alleviate his symptoms.  Denies any blood thinner use.  Denies any visual disturbance, numbness, weakness, facial asymmetry, dysarthria, neck pain, back pain, saddle anesthesia, bowel or bladder dysfunction.  Spanish interpreter was used to conduct this interview   Fall Associated symptoms include headaches. Pertinent negatives include no chest pain, no abdominal pain and no shortness of breath.      History reviewed. No pertinent past medical history.  There are no problems to display for this patient.   History reviewed. No pertinent surgical history.     Family History  Problem Relation Age of Onset   Cancer Neg Hx    Diabetes Neg Hx    Heart failure Neg Hx     Social History   Tobacco Use   Smoking status: Never   Smokeless tobacco: Never  Substance Use Topics   Alcohol use: Yes    Alcohol/week: 0.0 standard drinks   Drug use: No    Home Medications Prior to Admission medications   Medication Sig Start Date End Date Taking? Authorizing Provider  miconazole (MICOTIN) 2 % cream Apply 1 application topically 2 (two) times daily. 02/16/21   Lamptey, Myrene Galas, MD    Allergies    Patient has no known allergies.  Review of Systems    Review of Systems  Constitutional:  Negative for chills and fever.  HENT:  Negative for facial swelling.   Eyes:  Negative for visual disturbance.  Respiratory:  Negative for shortness of breath.   Cardiovascular:  Negative for chest pain.  Gastrointestinal:  Negative for abdominal pain, nausea and vomiting.  Genitourinary:  Negative for difficulty urinating and dysuria.  Musculoskeletal:  Negative for back pain and neck pain.  Skin:  Negative for color change and rash.  Neurological:  Positive for syncope and headaches. Negative for dizziness, tremors, seizures, facial asymmetry, speech difficulty, weakness, light-headedness and numbness.  Psychiatric/Behavioral:  Negative for confusion.    Physical Exam Updated Vital Signs BP (!) 145/84 (BP Location: Right Arm)    Pulse 86    Temp 99.1 F (37.3 C) (Oral)    Resp 18    SpO2 100%   Physical Exam Vitals and nursing note reviewed.  Constitutional:      General: He is not in acute distress.    Appearance: He is not ill-appearing, toxic-appearing or diaphoretic.  HENT:     Head: Normocephalic and atraumatic. No raccoon eyes, Battle's sign, abrasion, contusion, right periorbital erythema, left periorbital erythema or laceration.     Jaw: No trismus, tenderness, swelling, pain on movement or malocclusion.      Comments: Patient has tenderness to right parietal aspect of head as noted above Eyes:  General: No scleral icterus.       Right eye: No discharge.        Left eye: No discharge.     Extraocular Movements: Extraocular movements intact.     Conjunctiva/sclera: Conjunctivae normal.     Pupils: Pupils are equal, round, and reactive to light.  Cardiovascular:     Rate and Rhythm: Normal rate.  Pulmonary:     Effort: Pulmonary effort is normal.  Abdominal:     Palpations: Abdomen is soft.     Tenderness: There is no abdominal tenderness.  Musculoskeletal:     Cervical back: Normal range of motion and neck supple. No  swelling, edema, deformity, erythema, signs of trauma, lacerations, rigidity, spasms, torticollis, tenderness, bony tenderness or crepitus. No pain with movement, spinous process tenderness or muscular tenderness. Normal range of motion.     Thoracic back: No swelling, edema, deformity, signs of trauma, lacerations, spasms, tenderness or bony tenderness.     Lumbar back: No swelling, edema, deformity, signs of trauma, lacerations, spasms, tenderness or bony tenderness.     Comments: No midline tenderness or deformity to cervical, thoracic, or lumbar spine.  Skin:    General: Skin is warm and dry.  Neurological:     General: No focal deficit present.     Mental Status: He is alert.     GCS: GCS eye subscore is 4. GCS verbal subscore is 5. GCS motor subscore is 6.     Cranial Nerves: No cranial nerve deficit, dysarthria or facial asymmetry.     Sensory: Sensation is intact.     Motor: No weakness, tremor, seizure activity or pronator drift.     Coordination: Romberg sign negative. Finger-Nose-Finger Test normal.     Gait: Gait is intact. Gait normal.     Comments: CN II-XII intact, equal grip strength, +5 strength to bilateral upper and lower extremities, sensation to light touch intact to bilateral upper and lower extremities  Psychiatric:        Behavior: Behavior is cooperative.    ED Results / Procedures / Treatments   Labs (all labs ordered are listed, but only abnormal results are displayed) Labs Reviewed - No data to display  EKG None  Radiology CT Head Wo Contrast  Result Date: 06/25/2021 CLINICAL DATA:  Fall.  Blunt head trauma.  Headache. EXAM: CT HEAD WITHOUT CONTRAST TECHNIQUE: Contiguous axial images were obtained from the base of the skull through the vertex without intravenous contrast. COMPARISON:  None. FINDINGS: Brain: No evidence of acute infarction, hemorrhage, hydrocephalus, extra-axial collection, or mass lesion/mass effect. Mild diffuse cerebral and cerebellar  atrophy noted. Vascular:  No hyperdense vessel or other acute findings. Skull: No evidence of fracture or other significant bone abnormality. Sinuses/Orbits:  No acute findings. Other: None. IMPRESSION: No acute intracranial abnormality. Mild diffuse cerebral and cerebellar atrophy. Electronically Signed   By: Marlaine Hind M.D.   On: 06/25/2021 14:59    Procedures Procedures   Medications Ordered in ED Medications - No data to display  ED Course  I have reviewed the triage vital signs and the nursing notes.  Pertinent labs & imaging results that were available during my care of the patient were reviewed by me and considered in my medical decision making (see chart for details).    MDM Rules/Calculators/A&P                          Alert 40 year old male no acute distress, nontoxic-appearing.  Presents emergency department with chief complaint of pain to right side of his head after suffering a fall.  Fall occurred 8 days prior.  Patient endorses hitting his head and falling down a flight of stairs after mechanical slip and fall.  Endorses syncopal episode with this event.  Patient has had pain to the right side of his head since then.  Denies any blood thinner use.  No midline tenderness or deformity to cervical, thoracic, or lumbar spine.  Neuro exam is reassuring.  Patient denies any numbness, weakness, saddle anesthesia, bowel or bladder dysfunction.  Low suspicion for spinal injury at this time.  Noncontrasted CT ordered in triage.  CT imaging shows no acute intracranial abnormality.  Patient continues to have no focal neurological deficits.  Plan to discharge patient at this time.  Discussed symptomatic treatment with patient.  Discussed strict return precautions.  Patient was advised that in the future if he is concerned after head injury to come to the emergency department immediately after injury.  Discussed results, findings, treatment and follow up. Patient advised of return  precautions. Patient verbalized understanding and agreed with plan.      Final Clinical Impression(s) / ED Diagnoses Final diagnoses:  Fall, initial encounter  Injury of head, initial encounter    Rx / DC Orders ED Discharge Orders     None        Haskel Schroeder, PA-C 06/25/21 1556    Horton, Clabe Seal, DO 06/26/21 1531

## 2021-06-25 NOTE — ED Provider Notes (Signed)
Emergency Medicine Provider Triage Evaluation Note  Brandon Irwin , a 40 y.o. male  was evaluated in triage.  Pt complains of right-sided head pain.  Patient reports that he fell down a flight of wooden steps 8 days prior.  Patient endorses hitting his head and loss of consciousness.  Patient has had pain to the right side of his head since this incident.  Review of Systems  Positive: Head pain, syncope Negative: Numbness, weakness, facial asymmetry, dysarthria, visual disturbance, neck pain, back pain, saddle anesthesia, bowel or bladder incontinence  Physical Exam  BP 135/82 (BP Location: Right Arm)    Pulse 74    Temp 98.7 F (37.1 C) (Oral)    Resp 18    SpO2 99%  Gen:   Awake, no distress   Resp:  Normal effort  MSK:   Moves extremities without difficulty; no midline tenderness deformity to cervical, thoracic, or lumbar spine. Other:  CN II through XII intact.  Patient moves all limbs equally without difficulty.  No raccoon eyes or battle sign.  No contusion, laceration or ecchymosis noted to patient's head.  Medical Decision Making  Medically screening exam initiated at 2:27 PM.  Appropriate orders placed.  Melchizedek Espinola was informed that the remainder of the evaluation will be completed by another provider, this initial triage assessment does not replace that evaluation, and the importance of remaining in the ED until their evaluation is complete.  Due to patient reporting falling down flight of stairs will obtain noncontrast head CT to evaluate for intracranial abnormality however low suspicion at this time.   Haskel Schroeder, PA-C 06/25/21 1429    Rozelle Logan, DO 06/26/21 1531

## 2021-06-25 NOTE — ED Triage Notes (Signed)
Pt reports falling 8 days ago, fell down wooden steps and hit right side of his head. Reports +loc. Still has right side head pain, denies n/v. No distress noted at triage.

## 2021-06-25 NOTE — Discharge Instructions (Signed)
[facility] today to be evaluated for your head injury.  Your physical exam was reassuring.  The CT scan of your head showed no bleeding or masses concerning for cancer.  Your pain should gradually improve over time.  Please take Ibuprofen (Advil, motrin) and Tylenol (acetaminophen) to relieve your pain.    You may take up to 600 MG (3 pills) of normal strength ibuprofen every 8 hours as needed.   You make take tylenol, up to 1,000 mg (two extra strength pills) every 8 hours as needed.   It is safe to take ibuprofen and tylenol at the same time as they work differently.   Do not take more than 3,000 mg tylenol in a 24 hour period (not more than one dose every 8 hours.  Please check all medication labels as many medications such as pain and cold medications may contain tylenol.  Do not drink alcohol while taking these medications.  Do not take other NSAID'S while taking ibuprofen (such as aleve or naproxen).  Please take ibuprofen with food to decrease stomach upset.  Get help right away if: You have: A severe headache that is not helped by medicine. Trouble walking or weakness in your arms and legs. Clear or bloody fluid coming from your nose or ears. Changes in your vision. A seizure. Increased confusion or irritability. Your symptoms get worse. You are sleepier than normal and have trouble staying awake. You lose your balance. Your pupils change size. Your speech is slurred. Your dizziness gets worse. You vomit.

## 2021-11-14 ENCOUNTER — Emergency Department (HOSPITAL_COMMUNITY)
Admission: EM | Admit: 2021-11-14 | Discharge: 2021-11-15 | Disposition: A | Discharge status: Home / Self Care | Payer: Self-pay | Attending: Emergency Medicine | Admitting: Emergency Medicine

## 2021-11-14 ENCOUNTER — Encounter (HOSPITAL_COMMUNITY): Payer: Self-pay | Admitting: *Deleted

## 2021-11-14 ENCOUNTER — Other Ambulatory Visit: Payer: Self-pay

## 2021-11-14 ENCOUNTER — Emergency Department (HOSPITAL_COMMUNITY): Payer: Self-pay

## 2021-11-14 DIAGNOSIS — S0990XA Unspecified injury of head, initial encounter: Secondary | ICD-10-CM | POA: Insufficient documentation

## 2021-11-14 DIAGNOSIS — S41111A Laceration without foreign body of right upper arm, initial encounter: Secondary | ICD-10-CM

## 2021-11-14 DIAGNOSIS — Y92096 Garden or yard of other non-institutional residence as the place of occurrence of the external cause: Secondary | ICD-10-CM | POA: Insufficient documentation

## 2021-11-14 DIAGNOSIS — S51811A Laceration without foreign body of right forearm, initial encounter: Secondary | ICD-10-CM | POA: Insufficient documentation

## 2021-11-14 DIAGNOSIS — W293XXA Contact with powered garden and outdoor hand tools and machinery, initial encounter: Secondary | ICD-10-CM | POA: Insufficient documentation

## 2021-11-14 DIAGNOSIS — Z23 Encounter for immunization: Secondary | ICD-10-CM | POA: Insufficient documentation

## 2021-11-14 LAB — COMPREHENSIVE METABOLIC PANEL
ALT: 41 U/L (ref 0–44)
AST: 27 U/L (ref 15–41)
Albumin: 3.9 g/dL (ref 3.5–5.0)
Alkaline Phosphatase: 65 U/L (ref 38–126)
Anion gap: 11 (ref 5–15)
BUN: 12 mg/dL (ref 6–20)
CO2: 19 mmol/L — ABNORMAL LOW (ref 22–32)
Calcium: 8.7 mg/dL — ABNORMAL LOW (ref 8.9–10.3)
Chloride: 103 mmol/L (ref 98–111)
Creatinine, Ser: 0.89 mg/dL (ref 0.61–1.24)
GFR, Estimated: >60 mL/min (ref >60)
Glucose, Bld: 135 mg/dL — ABNORMAL HIGH (ref 70–99)
Potassium: 3.6 mmol/L (ref 3.5–5.1)
Sodium: 133 mmol/L — ABNORMAL LOW (ref 135–145)
Total Bilirubin: 0.6 mg/dL (ref 0.3–1.2)
Total Protein: 7.3 g/dL (ref 6.5–8.1)

## 2021-11-14 LAB — CBC WITH DIFFERENTIAL/PLATELET
Abs Immature Granulocytes: 0.04 10*3/uL (ref 0.00–0.07)
Basophils Absolute: 0.1 10*3/uL (ref 0.0–0.1)
Basophils Relative: 1 %
Eosinophils Absolute: 0.4 10*3/uL (ref 0.0–0.5)
Eosinophils Relative: 5 %
HCT: 38 % — ABNORMAL LOW (ref 39.0–52.0)
Hemoglobin: 12.9 g/dL — ABNORMAL LOW (ref 13.0–17.0)
Immature Granulocytes: 0 %
Lymphocytes Relative: 36 %
Lymphs Abs: 3.4 10*3/uL (ref 0.7–4.0)
MCH: 29.2 pg (ref 26.0–34.0)
MCHC: 33.9 g/dL (ref 30.0–36.0)
MCV: 86 fL (ref 80.0–100.0)
Monocytes Absolute: 0.5 10*3/uL (ref 0.1–1.0)
Monocytes Relative: 6 %
Neutro Abs: 4.9 10*3/uL (ref 1.7–7.7)
Neutrophils Relative %: 52 %
Platelets: 316 10*3/uL (ref 150–400)
RBC: 4.42 MIL/uL (ref 4.22–5.81)
RDW: 13.1 % (ref 11.5–15.5)
WBC: 9.4 10*3/uL (ref 4.0–10.5)
nRBC: 0 % (ref 0.0–0.2)

## 2021-11-14 LAB — ETHANOL: Alcohol, Ethyl (B): 259 mg/dL — ABNORMAL HIGH (ref <10)

## 2021-11-14 MED ORDER — TETANUS-DIPHTH-ACELL PERTUSSIS 5-2.5-18.5 LF-MCG/0.5 IM SUSY
0.5000 mL | PREFILLED_SYRINGE | Freq: Once | INTRAMUSCULAR | Status: AC
  Administered 2021-11-15: 0.5 mL via INTRAMUSCULAR
  Filled 2021-11-14: qty 0.5
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
## 2021-11-15 ENCOUNTER — Emergency Department (HOSPITAL_COMMUNITY): Payer: Self-pay

## 2021-11-15 MED ORDER — LIDOCAINE-EPINEPHRINE (PF) 2 %-1:200000 IJ SOLN
10.0000 mL | Freq: Once | INTRAMUSCULAR | Status: AC
  Administered 2021-11-15: 10 mL via INTRADERMAL
  Filled 2021-11-15: qty 20

## 2021-11-15 MED ORDER — CEPHALEXIN 500 MG PO CAPS: ORAL_CAPSULE | ORAL | 0 refills | Status: DC

## 2021-11-15 MED ORDER — CEPHALEXIN 250 MG PO CAPS
1000.0000 mg | ORAL_CAPSULE | Freq: Once | ORAL | Status: AC
Start: 2021-11-15 — End: 2021-11-15
  Administered 2021-11-15: 1000 mg via ORAL
  Filled 2021-11-15 (×2): qty 4

## 2021-11-15 NOTE — Discharge Instructions (Addendum)
Return for redness drainage or if you get a fever.  The sutures I placed typically removed between day 7 and 10.  The area can get wet but not fully immersed underwater.  No scrubbing.  If you really want to clean it you can apply a half-and-half hydrogen peroxide solution with water on a Q-tip.  You can apply an ointment a couple times a day this could be as simple as Vaseline but could also be an antibiotic ointment if you wish..  Once it is healed please try to avoid prolonged sun exposure use sunscreen.  ? ?Regrese por drenaje de enrojecimiento o si tiene fiebre. Las suturas que coloqu? generalmente se retiran [employer] d?as 7 y 22. El ?rea puede mojarse pero no sumergirse completamente bajo el agua. Sin frotar. Si realmente desea limpiarlo, puede aplicar una soluci?n de per?xido de hidr?geno mitad y mitad con agua en un hisopo. Puede aplicar un ung?ento un par de veces al d?a, esto podr?a ser tan simple como la vaselina, pero tambi?n podr?a ser un ung?ento antibi?tico si lo desea. Una vez que se haya curado, trate de evitar la exposici?n prolongada al sol y use protector solar. ? ?

## 2021-11-15 NOTE — ED Provider Notes (Signed)
?[facility] ?Provider Note ? ? ?CSN: [id number] ?Arrival date & time: 11/14/21  2124 ? ?  ? ?History ? ?Chief Complaint  ?Patient presents with  ? Laceration  ? ? ?Brandon Irwin is a 41 y.o. male. ? ?41 yo M with chief complaint of a right forearm laceration.  Patient states that he was doing yard work today and he walked by his truck and ended up walking into some sharp lawn tools.  This happened about 6 PM.  He denies other significant injury. ? ? ?Laceration ? ?  ? ?Home Medications ?Prior to Admission medications   ?Medication Sig Start Date End Date Taking? Authorizing Provider  ?cephALEXin (KEFLEX) 500 MG capsule 2 caps po bid x 7 days 11/15/21  Yes Deno Etienne, DO  ?miconazole (MICOTIN) 2 % cream Apply 1 application topically 2 (two) times daily. 02/16/21   Lamptey, Myrene Galas, MD  ?   ? ?Allergies    ?Patient has no known allergies.   ? ?Review of Systems   ?Review of Systems ? ?Physical Exam ?Updated Vital Signs ?BP 107/77 (BP Location: Left Arm)   Pulse 73   Temp 98 ?F (36.7 ?C) (Oral)   Resp 17   Ht 5\' 4"  (1.626 m)   Wt 70.3 kg   SpO2 98%   BMI 26.60 kg/m?  ?Physical Exam ?Vitals and nursing note reviewed.  ?Constitutional:   ?   Appearance: He is well-developed.  ?HENT:  ?   Head: Normocephalic and atraumatic.  ?Eyes:  ?   Pupils: Pupils are equal, round, and reactive to light.  ?Neck:  ?   Vascular: No JVD.  ?Cardiovascular:  ?   Rate and Rhythm: Normal rate and regular rhythm.  ?   Heart sounds: No murmur heard. ?  No friction rub. No gallop.  ?Pulmonary:  ?   Effort: No respiratory distress.  ?   Breath sounds: No wheezing.  ?Abdominal:  ?   General: There is no distension.  ?   Tenderness: There is no abdominal tenderness. There is no guarding or rebound.  ?Musculoskeletal:     ?   General: Normal range of motion.  ?   Cervical back: Normal range of motion and neck supple.  ?   Comments: Multiple superficial abrasions to the right forearm with a gaping  circular laceration to the forearm about 2 cm distal to the elbow.  It is along the medial aspect.  Pulse motor and sensation are intact distally.  Grossly contaminated.  ?Skin: ?   Coloration: Skin is not pale.  ?   Findings: No rash.  ?Neurological:  ?   Mental Status: He is alert and oriented to person, place, and time.  ?Psychiatric:     ?   Behavior: Behavior normal.  ? ? ?ED Results / Procedures / Treatments   ?Labs ?(all labs ordered are listed, but only abnormal results are displayed) ?Labs Reviewed  ?COMPREHENSIVE METABOLIC PANEL - Abnormal; Notable for the following components:  ?    Result Value  ? Sodium 133 (*)   ? CO2 19 (*)   ? Glucose, Bld 135 (*)   ? Calcium 8.7 (*)   ? All other components within normal limits  ?CBC WITH DIFFERENTIAL/PLATELET - Abnormal; Notable for the following components:  ? Hemoglobin 12.9 (*)   ? HCT 38.0 (*)   ? All other components within normal limits  ?ETHANOL - Abnormal; Notable for the following components:  ? Alcohol, Ethyl (B)  259 (*)   ? All other components within normal limits  ? ? ?EKG ?None ? ?Radiology ?DG Elbow Complete Right ? ?Result Date: 11/14/2021 ?CLINICAL DATA:  Fall off trailer. EXAM: RIGHT ELBOW - COMPLETE 3+ VIEW COMPARISON:  None Available. FINDINGS: Small amount of soft tissue gas noted in the posterior forearm soft tissues. No radiopaque foreign body. No fracture, subluxation or dislocation. IMPRESSION: No acute bony abnormality. Small amount of gas within the posterior soft tissues in the proximal to mid forearm. Electronically Signed   By: Rolm Baptise M.D.   On: 11/14/2021 23:26  ? ?DG Forearm Right ? ?Result Date: 11/15/2021 ?CLINICAL DATA:  Laceration, question foreign body EXAM: RIGHT FOREARM - 2 VIEW COMPARISON:  None Available. FINDINGS: Locules of gas within the soft tissues of the forearm posteriorly. No radiopaque foreign body. No acute bony abnormality. Specifically, no fracture, subluxation, or dislocation. IMPRESSION: No fracture or  foreign body. Electronically Signed   By: Rolm Baptise M.D.   On: 11/15/2021 02:07  ? ?CT Head Wo Contrast ? ?Result Date: 11/14/2021 ?CLINICAL DATA:  Head trauma, abnormal mental status (Age 1-64y). Fall. EXAM: CT HEAD WITHOUT CONTRAST TECHNIQUE: Contiguous axial images were obtained from the base of the skull through the vertex without intravenous contrast. RADIATION DOSE REDUCTION: This exam was performed according to the departmental dose-optimization program which includes automated exposure control, adjustment of the mA and/or kV according to patient size and/or use of iterative reconstruction technique. COMPARISON:  06/25/2021 FINDINGS: Brain: No acute intracranial abnormality. Specifically, no hemorrhage, hydrocephalus, mass lesion, acute infarction, or significant intracranial injury. Vascular: No hyperdense vessel or unexpected calcification. Skull: No acute calvarial abnormality. Sinuses/Orbits: No acute findings Other: None IMPRESSION: No acute intracranial abnormality. Electronically Signed   By: Rolm Baptise M.D.   On: 11/14/2021 23:35  ? ?CT Cervical Spine Wo Contrast ? ?Result Date: 11/14/2021 ?CLINICAL DATA:  Fall. Neck trauma, intoxicated or obtunded (Age >= 16y) EXAM: CT CERVICAL SPINE WITHOUT CONTRAST TECHNIQUE: Multidetector CT imaging of the cervical spine was performed without intravenous contrast. Multiplanar CT image reconstructions were also generated. RADIATION DOSE REDUCTION: This exam was performed according to the departmental dose-optimization program which includes automated exposure control, adjustment of the mA and/or kV according to patient size and/or use of iterative reconstruction technique. COMPARISON:  None Available. FINDINGS: Alignment: Normal Skull base and vertebrae: No acute fracture. No primary bone lesion or focal pathologic process. Soft tissues and spinal canal: No prevertebral fluid or swelling. No visible canal hematoma. Disc levels:  Maintained Upper chest: Negative  Other: None IMPRESSION: No acute bony abnormality. Electronically Signed   By: Rolm Baptise M.D.   On: 11/14/2021 23:36   ? ?Procedures ?Marland Kitchen.Laceration Repair ? ?Date/Time: 11/15/2021 4:26 AM ?Performed by: Deno Etienne, DO ?Authorized by: Deno Etienne, DO  ? ?Consent:  ?  Consent obtained:  Verbal ?  Consent given by:  Patient ?  Risks, benefits, and alternatives were discussed: yes   ?  Risks discussed:  Infection, pain, poor cosmetic result and poor wound healing ?  Alternatives discussed:  No treatment, delayed treatment and observation ?Universal protocol:  ?  Procedure explained and questions answered to patient or proxy's satisfaction: yes   ?  Relevant documents present and verified: yes   ?  Imaging studies available: yes   ?  Immediately prior to procedure, a time out was called: yes   ?  Patient identity confirmed:  Verbally with patient ?Anesthesia:  ?  Anesthesia method:  Local infiltration ?  Local anesthetic:  Lidocaine 2% WITH epi ?Laceration details:  ?  Location:  Shoulder/arm ?  Shoulder/arm location:  R lower arm ?  Length (cm):  5.7 ?Pre-procedure details:  ?  Preparation:  Patient was prepped and draped in usual sterile fashion and imaging obtained to evaluate for foreign bodies ?Exploration:  ?  Limited defect created (wound extended): no   ?  Hemostasis achieved with:  Epinephrine and direct pressure ?  Imaging obtained: x-ray   ?  Imaging outcome: foreign body not noted   ?  Wound exploration: entire depth of wound visualized   ?  Wound extent: no underlying fracture noted   ?  Contaminated: yes   ?Treatment:  ?  Area cleansed with:  Saline and soap and water ?  Amount of cleaning:  Extensive ?  Irrigation solution:  Tap water and sterile water ?  Irrigation volume:  3L ?  Irrigation method:  Pressure wash ?  Visualized foreign bodies/material removed: yes   ?  Debridement:  Minimal ?  Undermining:  None ?  Scar revision: no   ?Skin repair:  ?  Repair method:  Sutures ?  Suture size:  3-0 ?  Suture  material:  Nylon ?  Suture technique:  Simple interrupted ?  Number of sutures:  7 ?Approximation:  ?  Approximation:  Close ?Repair type:  ?  Repair type:  Intermediate ?Post-procedure details:  ?  Dressing:  Antibi

## 2021-11-23 ENCOUNTER — Encounter (HOSPITAL_COMMUNITY): Payer: Self-pay | Admitting: *Deleted

## 2021-11-23 ENCOUNTER — Ambulatory Visit (HOSPITAL_COMMUNITY)
Admission: EM | Admit: 2021-11-23 | Discharge: 2021-11-23 | Disposition: A | Discharge status: Home / Self Care | Payer: Self-pay | Attending: Internal Medicine | Admitting: Internal Medicine

## 2021-11-23 ENCOUNTER — Other Ambulatory Visit: Payer: Self-pay

## 2021-11-23 NOTE — ED Notes (Signed)
5 sutures removed. Skin intact.

## 2021-11-23 NOTE — ED Triage Notes (Signed)
Sutures placed 11-14-21. Pt comes in today for removal of sutures on Rt anterior fore arm.

## 2021-11-23 NOTE — ED Provider Notes (Signed)
Patient in for nurse visit to have sutures removed.  RN requested me to evaluate wound. Evaluated wound, no further interaction indicated. Continue to apply neosporin and keep bandaged while away from home.   Bing Neighbors, FNP 11/23/21 1704

## 2022-02-22 ENCOUNTER — Encounter (HOSPITAL_COMMUNITY): Payer: Self-pay

## 2022-02-22 ENCOUNTER — Ambulatory Visit (HOSPITAL_COMMUNITY)
Admission: EM | Admit: 2022-02-22 | Discharge: 2022-02-22 | Disposition: A | Discharge status: Home / Self Care | Payer: Self-pay | Attending: Physician Assistant | Admitting: Physician Assistant

## 2022-02-22 DIAGNOSIS — J4521 Mild intermittent asthma with (acute) exacerbation: Secondary | ICD-10-CM

## 2022-02-22 DIAGNOSIS — R058 Other specified cough: Secondary | ICD-10-CM

## 2022-02-22 DIAGNOSIS — R062 Wheezing: Secondary | ICD-10-CM

## 2022-02-22 MED ORDER — METHYLPREDNISOLONE SODIUM SUCC 125 MG IJ SOLR
INTRAMUSCULAR | Status: AC
  Filled 2022-02-22: qty 2

## 2022-02-22 MED ORDER — ALBUTEROL SULFATE HFA 108 (90 BASE) MCG/ACT IN AERS
INHALATION_SPRAY | RESPIRATORY_TRACT | Status: AC
  Filled 2022-02-22: qty 6.7

## 2022-02-22 MED ORDER — METHYLPREDNISOLONE ACETATE 80 MG/ML IJ SUSP
60.0000 mg | Freq: Once | INTRAMUSCULAR | Status: AC
  Administered 2022-02-22: 60 mg via INTRAMUSCULAR

## 2022-02-22 MED ORDER — ALBUTEROL SULFATE HFA 108 (90 BASE) MCG/ACT IN AERS
2.0000 | INHALATION_SPRAY | Freq: Once | RESPIRATORY_TRACT | Status: AC
  Administered 2022-02-22: 2 via RESPIRATORY_TRACT

## 2022-02-22 MED ORDER — BUDESONIDE-FORMOTEROL FUMARATE 80-4.5 MCG/ACT IN AERO: 1.0000 | INHALATION_SPRAY | Freq: Two times a day (BID) | RESPIRATORY_TRACT | 1 refills | Status: DC

## 2022-02-22 NOTE — ED Triage Notes (Signed)
Pt states that when he's trying to lay down to sleep he has coughing fits sometimes followed by wheezing.

## 2022-02-22 NOTE — Discharge Instructions (Addendum)
We gave an injection of steroids.  Use albuterol inhaler every 4-6 hours as needed.  Start Symbicort.  Make sure to rinse her mouth following use of this medication to prevent thrush.  We will contact you to help schedule a primary care appointment.  If you have any worsening symptoms including shortness of breath, chest pain, nausea, vomiting, fever you need to be seen immediately.

## 2022-02-22 NOTE — ED Triage Notes (Signed)
Pt denies current c/o SOB

## 2022-02-22 NOTE — ED Provider Notes (Addendum)
[facility]    CSN: [id number] Arrival date & time: 02/22/22  1922      History   Chief Complaint Chief Complaint  Patient presents with   Cough    HPI Brandon Irwin is a 41 y.o. male.   Patient presents today with a several month history of nocturnal cough with associated wheezing.  Patient is Spanish-speaking but interpreter is utilized during visit.  He denies any current shortness of breath or wheezing but is able to provide a video of significant wheezing.  He denies formal diagnosis of asthma or COPD.  He does not smoke.  He has been seen and started on antibiotics but does not provide any relief of symptoms.  He denies any known sick contacts or additional symptoms including fever, congestion, nausea, vomiting.  Denies any recent steroid use.  He denies history of diabetes.  He reports that coughing and wheezing keep him up at night he has difficulty performing daily activities.    History reviewed. No pertinent past medical history.  There are no problems to display for this patient.   History reviewed. No pertinent surgical history.     Home Medications    Prior to Admission medications   Medication Sig Start Date End Date Taking? Authorizing Provider  budesonide-formoterol (SYMBICORT) 80-4.5 MCG/ACT inhaler Inhale 1 puff into the lungs 2 (two) times daily. 02/22/22  Yes Consuela Widener, Noberto Retort, PA-C  cephALEXin (KEFLEX) 500 MG capsule 2 caps po bid x 7 days 11/15/21   Melene Plan, DO  miconazole (MICOTIN) 2 % cream Apply 1 application topically 2 (two) times daily. 02/16/21   LampteyBritta Mccreedy, MD    Family History Family History  Problem Relation Age of Onset   Cancer Neg Hx    Diabetes Neg Hx    Heart failure Neg Hx     Social History Social History   Tobacco Use   Smoking status: Never   Smokeless tobacco: Never  Substance Use Topics   Alcohol use: Yes    Alcohol/week: 0.0 standard drinks of alcohol   Drug use: No     Allergies   Patient has  no known allergies.   Review of Systems Review of Systems  Constitutional:  Positive for activity change. Negative for appetite change, fatigue and fever.  HENT:  Negative for congestion, sinus pressure, sneezing and sore throat.   Respiratory:  Positive for cough, shortness of breath and wheezing. Negative for chest tightness.   Cardiovascular:  Negative for chest pain.  Gastrointestinal:  Negative for abdominal pain, diarrhea, nausea and vomiting.  Neurological:  Negative for dizziness, light-headedness and headaches.     Physical Exam Triage Vital Signs ED Triage Vitals  Enc Vitals Group     BP 02/22/22 1955 (!) 102/55     Pulse Rate 02/22/22 1955 96     Resp 02/22/22 1955 20     Temp 02/22/22 1955 98.4 F (36.9 C)     Temp Source 02/22/22 1955 Oral     SpO2 02/22/22 1955 97 %     Weight --      Height --      Head Circumference --      Peak Flow --      Pain Score 02/22/22 1958 0     Pain Loc --      Pain Edu? --      Excl. in GC? --    No data found.  Updated Vital Signs BP (!) 132/93 (BP Location: Right Arm)  Pulse 67   Temp 98.4 F (36.9 C) (Oral)   Resp 16   SpO2 97%   Visual Acuity Right Eye Distance:   Left Eye Distance:   Bilateral Distance:    Right Eye Near:   Left Eye Near:    Bilateral Near:     Physical Exam Vitals reviewed.  Constitutional:      General: He is awake.     Appearance: Normal appearance. He is well-developed. He is not ill-appearing.     Comments: Very pleasant male appears stated age in no acute distress sitting comfortably in exam room  HENT:     Head: Normocephalic and atraumatic.     Right Ear: Tympanic membrane, ear canal and external ear normal. Tympanic membrane is not erythematous or bulging.     Left Ear: Tympanic membrane, ear canal and external ear normal. Tympanic membrane is not erythematous or bulging.     Nose: Nose normal.     Mouth/Throat:     Pharynx: Uvula midline. Posterior oropharyngeal erythema  present. No oropharyngeal exudate or uvula swelling.  Cardiovascular:     Rate and Rhythm: Normal rate and regular rhythm.     Heart sounds: Normal heart sounds, S1 normal and S2 normal. No murmur heard. Pulmonary:     Effort: Pulmonary effort is normal. No accessory muscle usage or respiratory distress.     Breath sounds: Normal breath sounds. No stridor. No wheezing, rhonchi or rales.     Comments: Clear to auscultation bilaterally Neurological:     Mental Status: He is alert.  Psychiatric:        Behavior: Behavior is cooperative.      UC Treatments / Results  Labs (all labs ordered are listed, but only abnormal results are displayed) Labs Reviewed - No data to display  EKG   Radiology No results found.  Procedures Procedures (including critical care time)  Medications Ordered in UC Medications  albuterol (VENTOLIN HFA) 108 (90 Base) MCG/ACT inhaler 2 puff (2 puffs Inhalation Given 02/22/22 2023)  methylPREDNISolone acetate (DEPO-MEDROL) injection 60 mg (60 mg Intramuscular Given 02/22/22 2023)    Initial Impression / Assessment and Plan / UC Course  I have reviewed the triage vital signs and the nursing notes.  Pertinent labs & imaging results that were available during my care of the patient were reviewed by me and considered in my medical decision making (see chart for details).     Patient is well-appearing, afebrile, nontoxic, nontachycardic.  Given nocturnal symptoms with associated wheezing concern for asthma with acute exacerbation causing persistent symptoms.  No evidence of acute infection on physical exam that would warrant initiation.  Chest x-ray was deferred as there are no adventitious sounds on exam and oxygen saturation is 97%.  He has recently completed a course of antibiotics and I do not feel he needs additional antibiotics at this time.  He was given injection of Depo-Medrol in clinic.  He was given albuterol inhaler with instruction to use this every  4-6 hours as needed for acute shortness of breath and cough.  We will start Symbicort.  Discussed that he needs to rinse his mouth following use of this medication to prevent thrush.  He will need to follow-up with primary care to consider additional work-up including imaging and/or referral to pulmonology if symptoms or not improving quickly.  He does not currently have a PCP so we will try to establish him with someone via PCP assistance.  Discussed that if he has any  worsening symptoms he needs to be seen immediately including fever, chest pain, shortness of breath, nausea, vomiting he needs to go to the emergency room.  Strict return precautions given.  Final Clinical Impressions(s) / UC Diagnoses   Final diagnoses:  Mild intermittent asthma with acute exacerbation  Wheezing  Nocturnal cough     Discharge Instructions      We gave an injection of steroids.  Use albuterol inhaler every 4-6 hours as needed.  Start Symbicort.  Make sure to rinse her mouth following use of this medication to prevent thrush.  We will contact you to help schedule a primary care appointment.  If you have any worsening symptoms including shortness of breath, chest pain, nausea, vomiting, fever you need to be seen immediately.     ED Prescriptions     Medication Sig Dispense Auth. Provider   budesonide-formoterol (SYMBICORT) 80-4.5 MCG/ACT inhaler Inhale 1 puff into the lungs 2 (two) times daily. 1 each Zanovia Rotz, Noberto Retort, PA-C      PDMP not reviewed this encounter.   Jeani Hawking, PA-C 02/22/22 2022    Jeani Hawking, PA-C 02/22/22 2033

## 2022-04-14 ENCOUNTER — Ambulatory Visit (INDEPENDENT_AMBULATORY_CARE_PROVIDER_SITE_OTHER): Payer: Self-pay | Admitting: Family Medicine

## 2022-04-14 ENCOUNTER — Encounter (HOSPITAL_BASED_OUTPATIENT_CLINIC_OR_DEPARTMENT_OTHER): Payer: Self-pay | Admitting: Family Medicine

## 2022-04-14 ENCOUNTER — Other Ambulatory Visit (HOSPITAL_BASED_OUTPATIENT_CLINIC_OR_DEPARTMENT_OTHER): Payer: Self-pay

## 2022-04-14 DIAGNOSIS — M545 Low back pain, unspecified: Secondary | ICD-10-CM

## 2022-04-14 DIAGNOSIS — J45909 Unspecified asthma, uncomplicated: Secondary | ICD-10-CM | POA: Insufficient documentation

## 2022-04-14 DIAGNOSIS — Z0000 Encounter for general adult medical examination without abnormal findings: Secondary | ICD-10-CM

## 2022-04-14 DIAGNOSIS — J4530 Mild persistent asthma, uncomplicated: Secondary | ICD-10-CM

## 2022-04-14 MED ORDER — ALBUTEROL SULFATE HFA 108 (90 BASE) MCG/ACT IN AERS
1.0000 | INHALATION_SPRAY | Freq: Four times a day (QID) | RESPIRATORY_TRACT | 3 refills | Status: DC | PRN
  Filled 2022-04-14: qty 18, fill #0

## 2022-04-14 MED ORDER — BUDESONIDE-FORMOTEROL FUMARATE 80-4.5 MCG/ACT IN AERO
1.0000 | INHALATION_SPRAY | Freq: Two times a day (BID) | RESPIRATORY_TRACT | 3 refills | Status: DC
  Filled 2022-04-14: qty 1, fill #0

## 2022-04-14 NOTE — Assessment & Plan Note (Signed)
Uncertain etiology.  Would suspect musculoskeletal source, did provide handout reviewing home exercises.  We can proceed with initial laboratory testing to to assess for kidney function or other derangements.  Given lack of urinary symptoms, will not complete urinalysis today Monitor progress at follow-up visits as well as review of labs

## 2022-04-14 NOTE — Progress Notes (Signed)
New Patient Office Visit  Subjective    Patient ID: Brandon Irwin, male    DOB: [birth year]  Age: 41 y.o. MRN: [id number]  CC:  Chief Complaint  Patient presents with   New Patient (Initial Visit)    Pt here to establish new care, pt stated he has also has been having back pain on his left side     HPI Brandon Irwin presents to establish care Last PCP - at least 4 years ago In-person interpreter utilized for duration of visit.  Back pain: This has been present for about 1 month.  Primarily affecting lower left side of back.  He correlates that he feels that it could be related to his kidney.  He has been trying home remedies for kidney health.  He does not recall any specific injury.  He has not noticed any blood in the urine, no pain or burning with urination.  No fever, chills, sweats.  No radiation of pain.  Not specifically aware of any aggravating factors.  Asthma: Patient had recent visit to urgent care about 6 weeks ago with symptoms of cough and wheezing.  At that time it was felt that his symptoms were related to asthma exacerbation and due to persistent symptoms that he had had previously, he was started on Symbicort with albuterol as needed.  He reports that being on these inhalers has been helpful and that symptoms have been well controlled.  He is requesting refill of inhalers today  Has been having some cold feet at times, but his body overall feels warm. This has been present for a couple months.  Patient is originally from [place]. Has been living here for about 10 years. Patient works in Biomedical scientist. Outside of work, he enjoys going to the gym, work schedule has been busy and so not going as much as he used to.  Outpatient Encounter Medications as of 04/14/2022  Medication Sig   [DISCONTINUED] albuterol (VENTOLIN HFA) 108 (90 Base) MCG/ACT inhaler Inhale 1 puff into the lungs every 6 (six) hours as needed for wheezing.   [DISCONTINUED] budesonide-formoterol  (SYMBICORT) 80-4.5 MCG/ACT inhaler Inhale 1 puff into the lungs 2 (two) times daily.   albuterol (VENTOLIN HFA) 108 (90 Base) MCG/ACT inhaler Inhale 1-2 puffs into the lungs every 6 (six) hours as needed for wheezing.   budesonide-formoterol (SYMBICORT) 80-4.5 MCG/ACT inhaler Inhale 1 puff into the lungs 2 (two) times daily.   [DISCONTINUED] cephALEXin (KEFLEX) 500 MG capsule 2 caps po bid x 7 days   [DISCONTINUED] miconazole (MICOTIN) 2 % cream Apply 1 application topically 2 (two) times daily.   No facility-administered encounter medications on file as of 04/14/2022.    History reviewed. No pertinent past medical history.  History reviewed. No pertinent surgical history.  Family History  Problem Relation Age of Onset   Cancer Neg Hx    Diabetes Neg Hx    Heart failure Neg Hx     Social History   Socioeconomic History   Marital status: Significant Other    Spouse name: Not on file   Number of children: Not on file   Years of education: Not on file   Highest education level: Not on file  Occupational History   Not on file  Tobacco Use   Smoking status: Never   Smokeless tobacco: Never  Substance and Sexual Activity   Alcohol use: Yes    Alcohol/week: 0.0 standard drinks of alcohol   Drug use: No   Sexual activity: Yes  Other  Topics Concern   Not on file  Social History Narrative   Not on file   Social Determinants of Health   Financial Resource Strain: Not on file  Food Insecurity: Not on file  Transportation Needs: Not on file  Physical Activity: Not on file  Stress: Not on file  Social Connections: Not on file  Intimate Partner Violence: Not on file    Objective    BP (!) 118/97   Pulse 77   Temp 97.8 F (36.6 C) (Oral)   Ht 5\' 4"  (1.626 m)   Wt 182 lb 8 oz (82.8 kg)   SpO2 99%   BMI 31.33 kg/m   Physical Exam  41 year old male in no acute distress Cardiovascular exam with regular rate and rhythm, no murmur appreciated Lungs clear to  auscultation bilaterally Lumbar spine: No tenderness to palpation over spinous processes.  No significant tenderness to palpation through paraspinal muscles bilaterally.  Mild discomfort with assessing CVA tenderness on left side.  Negative straight leg raise bilaterally.  Normal back extension and forward flexion.  Assessment & Plan:   Problem List Items Addressed This Visit       Respiratory   Asthma    Mild persistent asthma on history Doing well with current therapy, will continue with use of Symbicort twice daily and albuterol as needed.  Provided refills of inhalers today Recommend continue to monitor symptoms, discussed recommendations for further evaluation should he develop any increase in symptoms such as nighttime cough, wheezing      Relevant Medications   albuterol (VENTOLIN HFA) 108 (90 Base) MCG/ACT inhaler   budesonide-formoterol (SYMBICORT) 80-4.5 MCG/ACT inhaler     Other   Low back pain    Uncertain etiology.  Would suspect musculoskeletal source, did provide handout reviewing home exercises.  We can proceed with initial laboratory testing to to assess for kidney function or other derangements.  Given lack of urinary symptoms, will not complete urinalysis today Monitor progress at follow-up visits as well as review of labs      Other Visit Diagnoses     Wellness examination       Relevant Orders   CBC with Differential/Platelet   Comprehensive metabolic panel   Lipid panel   TSH Rfx on Abnormal to Free T4   Hemoglobin A1c       Return in about 6 weeks (around 05/26/2022) for CPE.   Elcie Pelster J [place], MD

## 2022-04-14 NOTE — Assessment & Plan Note (Signed)
Mild persistent asthma on history Doing well with current therapy, will continue with use of Symbicort twice daily and albuterol as needed.  Provided refills of inhalers today Recommend continue to monitor symptoms, discussed recommendations for further evaluation should he develop any increase in symptoms such as nighttime cough, wheezing

## 2022-04-14 NOTE — Patient Instructions (Signed)
  Medication Instructions:  Your physician recommends that you continue on your current medications as directed. Please refer to the Current Medication list given to you today. --If you need a refill on any your medications before your next appointment, please call your pharmacy first. If no refills are authorized on file call the office.-- Lab Work: Your physician has recommended that you have lab work today: Yes If you have labs (blood work) drawn today and your tests are completely normal, you will receive your results via MyChart message OR a phone call from our staff.  Please ensure you check your voicemail in the event that you authorized detailed messages to be left on a delegated number. If you have any lab test that is abnormal or we need to change your treatment, we will call you to review the results.  Referrals/Procedures/Imaging: No  Follow-Up: Your next appointment:   Your physician recommends that you schedule a follow-up appointment in 1-2 months cpe with Dr. de Cuba.  You will receive a text message or e-mail with a link to a survey about your care and experience with us today! We would greatly appreciate your feedback!   Thanks for letting us be apart of your health journey!!  Primary Care and Sports Medicine   Dr. Raymond de Cuba   We encourage you to activate your patient portal called "MyChart".  Sign up information is provided on this After Visit Summary.  MyChart is used to connect with patients for Virtual Visits (Telemedicine).  Patients are able to view lab/test results, encounter notes, upcoming appointments, etc.  Non-urgent messages can be sent to your provider as well. To learn more about what you can do with MyChart, please visit --  [email or web address]    

## 2022-04-15 LAB — CBC WITH DIFFERENTIAL/PLATELET
Basophils Absolute: 0 10*3/uL (ref 0.0–0.2)
Basos: 1 %
EOS (ABSOLUTE): 0.5 10*3/uL — ABNORMAL HIGH (ref 0.0–0.4)
Eos: 7 %
Hematocrit: 44.2 % (ref 37.5–51.0)
Hemoglobin: 14.8 g/dL (ref 13.0–17.7)
Immature Grans (Abs): 0 10*3/uL (ref 0.0–0.1)
Immature Granulocytes: 0 %
Lymphocytes Absolute: 2.9 10*3/uL (ref 0.7–3.1)
Lymphs: 42 %
MCH: 29 pg (ref 26.6–33.0)
MCHC: 33.5 g/dL (ref 31.5–35.7)
MCV: 87 fL (ref 79–97)
Monocytes Absolute: 0.6 10*3/uL (ref 0.1–0.9)
Monocytes: 8 %
Neutrophils Absolute: 2.9 10*3/uL (ref 1.4–7.0)
Neutrophils: 42 %
Platelets: 368 10*3/uL (ref 150–450)
RBC: 5.11 x10E6/uL (ref 4.14–5.80)
RDW: 13.3 % (ref 11.6–15.4)
WBC: 6.8 10*3/uL (ref 3.4–10.8)

## 2022-04-15 LAB — COMPREHENSIVE METABOLIC PANEL
ALT: 47 IU/L — ABNORMAL HIGH (ref 0–44)
AST: 28 IU/L (ref 0–40)
Albumin/Globulin Ratio: 1.5 (ref 1.2–2.2)
Albumin: 4.6 g/dL (ref 4.1–5.1)
Alkaline Phosphatase: 84 IU/L (ref 44–121)
BUN/Creatinine Ratio: 12 (ref 9–20)
BUN: 10 mg/dL (ref 6–24)
Bilirubin Total: 0.5 mg/dL (ref 0.0–1.2)
CO2: 26 mmol/L (ref 20–29)
Calcium: 9.6 mg/dL (ref 8.7–10.2)
Chloride: 100 mmol/L (ref 96–106)
Creatinine, Ser: 0.83 mg/dL (ref 0.76–1.27)
Globulin, Total: 3 g/dL (ref 1.5–4.5)
Glucose: 104 mg/dL — ABNORMAL HIGH (ref 70–99)
Potassium: 4.6 mmol/L (ref 3.5–5.2)
Sodium: 138 mmol/L (ref 134–144)
Total Protein: 7.6 g/dL (ref 6.0–8.5)
eGFR: 112 mL/min/{1.73_m2} (ref >59)

## 2022-04-15 LAB — HEMOGLOBIN A1C
Est. average glucose Bld gHb Est-mCnc: 134 mg/dL
Hgb A1c MFr Bld: 6.3 % — ABNORMAL HIGH (ref 4.8–5.6)

## 2022-04-15 LAB — LIPID PANEL
Chol/HDL Ratio: 4.6 ratio (ref 0.0–5.0)
Cholesterol, Total: 223 mg/dL — ABNORMAL HIGH (ref 100–199)
HDL: 49 mg/dL (ref >39)
LDL Chol Calc (NIH): 133 mg/dL — ABNORMAL HIGH (ref 0–99)
Triglycerides: 228 mg/dL — ABNORMAL HIGH (ref 0–149)
VLDL Cholesterol Cal: 41 mg/dL — ABNORMAL HIGH (ref 5–40)

## 2022-04-15 LAB — TSH RFX ON ABNORMAL TO FREE T4: TSH: 2 u[IU]/mL (ref 0.450–4.500)

## 2022-06-02 ENCOUNTER — Ambulatory Visit (INDEPENDENT_AMBULATORY_CARE_PROVIDER_SITE_OTHER): Payer: Self-pay | Admitting: Family Medicine

## 2022-06-02 ENCOUNTER — Encounter (HOSPITAL_BASED_OUTPATIENT_CLINIC_OR_DEPARTMENT_OTHER): Payer: Self-pay | Admitting: Family Medicine

## 2022-06-02 VITALS — BP 126/94 | HR 66 | Temp 97.8°F | Ht 64.0 in | Wt 181.0 lb

## 2022-06-02 DIAGNOSIS — R7303 Prediabetes: Secondary | ICD-10-CM | POA: Insufficient documentation

## 2022-06-02 DIAGNOSIS — Z23 Encounter for immunization: Secondary | ICD-10-CM

## 2022-06-02 DIAGNOSIS — E785 Hyperlipidemia, unspecified: Secondary | ICD-10-CM | POA: Insufficient documentation

## 2022-06-02 DIAGNOSIS — Z0000 Encounter for general adult medical examination without abnormal findings: Secondary | ICD-10-CM | POA: Insufficient documentation

## 2022-06-02 NOTE — Patient Instructions (Signed)
  Medication Instructions:  Your physician recommends that you continue on your current medications as directed. Please refer to the Current Medication list given to you today. --If you need a refill on any your medications before your next appointment, please call your pharmacy first. If no refills are authorized on file call the office.-- Lab Work: Your physician has recommended that you have lab work today: No If you have labs (blood work) drawn today and your tests are completely normal, you will receive your results via MyChart message OR a phone call from our staff.  Please ensure you check your voicemail in the event that you authorized detailed messages to be left on a delegated number. If you have any lab test that is abnormal or we need to change your treatment, we will call you to review the results.  Referrals/Procedures/Imaging: No  Follow-Up: Your next appointment:   Your physician recommends that you schedule a follow-up appointment in: 6 months with Dr. de Cuba.  You will receive a text message or e-mail with a link to a survey about your care and experience with us today! We would greatly appreciate your feedback!   Thanks for letting us be apart of your health journey!!  Primary Care and Sports Medicine   Dr. Raymond de Cuba   We encourage you to activate your patient portal called "MyChart".  Sign up information is provided on this After Visit Summary.  MyChart is used to connect with patients for Virtual Visits (Telemedicine).  Patients are able to view lab/test results, encounter notes, upcoming appointments, etc.  Non-urgent messages can be sent to your provider as well. To learn more about what you can do with MyChart, please visit --  [email or web address].    

## 2022-06-02 NOTE — Progress Notes (Signed)
Subjective:    CC: Annual Physical Exam  HPI:  Brandon Irwin is a 41 y.o. presenting for annual physical  I reviewed the past medical history, family history, social history, surgical history, and allergies today and no changes were needed.  Please see the problem list section below in epic for further details.  Past Medical History: History reviewed. No pertinent past medical history. Past Surgical History: History reviewed. No pertinent surgical history. Social History: Social History   Socioeconomic History   Marital status: Significant Other    Spouse name: Not on file   Number of children: Not on file   Years of education: Not on file   Highest education level: Not on file  Occupational History   Not on file  Tobacco Use   Smoking status: Never   Smokeless tobacco: Never  Substance and Sexual Activity   Alcohol use: Yes    Alcohol/week: 0.0 standard drinks of alcohol   Drug use: No   Sexual activity: Yes  Other Topics Concern   Not on file  Social History Narrative   Not on file   Social Determinants of Health   Financial Resource Strain: Not on file  Food Insecurity: Not on file  Transportation Needs: Not on file  Physical Activity: Not on file  Stress: Not on file  Social Connections: Not on file   Family History: Family History  Problem Relation Age of Onset   Cancer Neg Hx    Diabetes Neg Hx    Heart failure Neg Hx    Allergies: No Known Allergies Medications: See med rec.  Review of Systems: No headache, visual changes, nausea, vomiting, diarrhea, constipation, dizziness, abdominal pain, skin rash, fevers, chills, night sweats, swollen lymph nodes, weight loss, chest pain, body aches, joint swelling, muscle aches, shortness of breath, mood changes, visual or auditory hallucinations.  Objective:    BP (!) 126/94 (BP Location: Left Arm, Patient Position: Sitting, Cuff Size: Large)   Pulse 66   Temp 97.8 F (36.6 C) (Oral)   Ht 5\' 4"  (1.626  m)   Wt 181 lb (82.1 kg)   SpO2 97%   BMI 31.07 kg/m   General: Well Developed, well nourished, and in no acute distress. Neuro: Alert and oriented x3, extra-ocular muscles intact, sensation grossly intact. Cranial nerves II through XII are intact, motor, sensory, and coordinative functions are all intact. HEENT: Normocephalic, atraumatic, pupils equal round reactive to light, neck supple, no masses, no lymphadenopathy, thyroid nonpalpable. Oropharynx, nasopharynx, external ear canals are unremarkable. Skin: Warm and dry, no rashes noted.  Cardiac: Regular rate and rhythm, no murmurs rubs or gallops.  Respiratory: Clear to auscultation bilaterally. Not using accessory muscles, speaking in full sentences.  Abdominal: Soft, nontender, nondistended, positive bowel sounds, no masses, no organomegaly. Musculoskeletal: Shoulder, elbow, wrist, hip, knee, ankle stable, and with full range of motion.  Impression and Recommendations:    Wellness examination Routine HCM labs reviewed. HCM reviewed/discussed. Anticipatory guidance regarding healthy weight, lifestyle and choices given. Recommend healthy diet.  Recommend approximately 150 minutes/week of moderate intensity exercise Recommend regular dental and vision exams Always use seatbelt/lap and shoulder restraints Recommend using smoke alarms and checking batteries at least twice a year Recommend using sunscreen when outside Discussed tetanus immunization recommendations, patient is UTD Recommend seasonal influenza vaccine, patient amenable, administered today  Return in about 6 months (around 12/01/2022) for PreDM. Likely check A1c at time of next appointment.   ___________________________________________ Gracelyn Coventry de 12/03/2022, MD, ABFM, CAQSM Primary Care and  Sports Medicine [facility]

## 2022-06-02 NOTE — Assessment & Plan Note (Signed)
Routine HCM labs reviewed. HCM reviewed/discussed. Anticipatory guidance regarding healthy weight, lifestyle and choices given. Recommend healthy diet.  Recommend approximately 150 minutes/week of moderate intensity exercise Recommend regular dental and vision exams Always use seatbelt/lap and shoulder restraints Recommend using smoke alarms and checking batteries at least twice a year Recommend using sunscreen when outside Discussed tetanus immunization recommendations, patient is UTD Recommend seasonal influenza vaccine, patient amenable, administered today 

## 2022-06-02 NOTE — Addendum Note (Signed)
Addended by: Hendricks Milo on: 06/02/2022 01:03 PM   Modules accepted: Orders

## 2022-08-16 ENCOUNTER — Encounter (HOSPITAL_BASED_OUTPATIENT_CLINIC_OR_DEPARTMENT_OTHER): Payer: Self-pay | Admitting: Family Medicine

## 2022-08-16 ENCOUNTER — Ambulatory Visit (INDEPENDENT_AMBULATORY_CARE_PROVIDER_SITE_OTHER): Payer: Self-pay | Admitting: Family Medicine

## 2022-08-16 VITALS — BP 131/84 | HR 70 | Ht 64.0 in | Wt 165.0 lb

## 2022-08-16 DIAGNOSIS — Z87898 Personal history of other specified conditions: Secondary | ICD-10-CM | POA: Insufficient documentation

## 2022-08-16 DIAGNOSIS — R634 Abnormal weight loss: Secondary | ICD-10-CM | POA: Insufficient documentation

## 2022-08-16 NOTE — Assessment & Plan Note (Signed)
Intentional weight loss through diet and exercise changes.

## 2022-08-16 NOTE — Progress Notes (Signed)
Established Patient Office Visit  Subjective   Patient ID: Brandon Irwin, male    DOB: 1981/02/05  Age: 42 y.o. MRN: [id number]  Chief Complaint  Patient presents with   Dizziness    Pt here for feeling dizzy and weak while exercising, stated he would like vitamins if possible   Interpreter in room during visit.   HPI Feeling weak and dizzy while exercising in the afternoon before eating dinner. Has started eating and then exercising one hour later with symptoms of weakness and dizziness improved.   Running for 30 minutes, lifts weights for 30 minutes and abdomen exercises for 5 days  per week. 90 minutes per day.   Has made significant diet and exercise changes since labs in October per PCP.   Continues to have dizziness for one month. Has lost weight: approx 20 pounds since October. 182 pounds and today weighs 165 pounds. Reports losing weight intentionally. Is exercising 450 minutes per week (much more than recommended 150 minutes per week)  and eating healthy diet. No red meat, no oil. Less processed carbohydrates.   Eats grilled chicken with broth, grilled fish, beans, eggs.   Decreased eating carbohydrates (tortillas).     Review of Systems  Constitutional:  Negative for chills and fever.  Eyes:  Negative for blurred vision and double vision.  Respiratory:  Negative for shortness of breath.   Cardiovascular:  Negative for chest pain.  Gastrointestinal:  Negative for abdominal pain, nausea and vomiting.  Neurological:  Positive for dizziness (has gotten better with eating before exercise.).      Objective:     BP 131/84 (BP Location: Right Arm, Patient Position: Sitting, Cuff Size: Large)   Pulse 70   Ht 5' 4"$  (1.626 m)   Wt 165 lb (74.8 kg)   SpO2 98%   BMI 28.32 kg/m  BP Readings from Last 3 Encounters:  08/16/22 131/84  06/02/22 (!) 126/94  04/14/22 (!) 118/97      Physical Exam Vitals and nursing note reviewed.  Constitutional:      General: He is  not in acute distress.    Appearance: Normal appearance. He is normal weight.  Cardiovascular:     Rate and Rhythm: Normal rate and regular rhythm.     Heart sounds: Normal heart sounds.  Pulmonary:     Effort: Pulmonary effort is normal.     Breath sounds: Normal breath sounds.  Musculoskeletal:        General: Normal range of motion.  Skin:    General: Skin is warm and dry.     Capillary Refill: Capillary refill takes less than 2 seconds.  Neurological:     General: No focal deficit present.     Mental Status: He is alert. Mental status is at baseline.     Cranial Nerves: No facial asymmetry.     Sensory: Sensation is intact.     Motor: Motor function is intact.     Coordination: Romberg sign negative. Coordination normal. Finger-Nose-Finger Test and Heel to [facility] Test normal.     Gait: Gait is intact.  Psychiatric:        Mood and Affect: Mood normal.        Behavior: Behavior normal.        Thought Content: Thought content normal.        Judgment: Judgment normal.     No results found for any visits on 08/16/22.    The 10-year ASCVD risk score (Arnett DK, et al., 2019)  is: 1.9%    Assessment & Plan:   Problem List Items Addressed This Visit     Recent weight loss - Primary    Intentional weight loss through diet and exercise changes.       Relevant Orders   CBC with Differential/Platelet   Hemoglobin A1c   Lipid panel   Comprehensive metabolic panel   123456 and Folate Panel   History of dizziness    Has made significant diet and lifestyle changes since October.  He reports that he has lost around 20 pounds, exercising 90 minutes/day 5 days/week to equal 450 minutes/week.  This is significantly above the recommended 150 minutes/week.  He has eliminated many of his processed carbohydrates,and red meats.  It is possible patient has decreased his caloric intake that is causing his feeling of dizziness and weakness with the significant increase in physical activity.  He  is neurologically intact in the office today.  Recommend increasing healthy calories, more beans, for fiber and protein.  Will get CBC, A1c, CMP, lipid panel, and vitamin B12 and folate levels.      Relevant Orders   CBC with Differential/Platelet   Hemoglobin A1c   Lipid panel   Comprehensive metabolic panel   123456 and Folate Panel  Wants to start on multi-vitamin and protein powder for increased calories.  This is reasonable to do.  Recommend increasing calories to meet exercise expenditure requirements.  Has appointment with  PCP in May 2024.  Return to office for fasting labs later this week.    Spent total time of 39 minutes with patient and interpreter.   Return if symptoms worsen or fail to improve, for has appointment with PCP in May, will get labs drawn later this week.    Chalmers Guest, FNP

## 2022-08-16 NOTE — Assessment & Plan Note (Addendum)
Has made significant diet and lifestyle changes since October.  He reports that he has lost around 20 pounds, exercising 90 minutes/day 5 days/week to equal 450 minutes/week.  This is significantly above the recommended 150 minutes/week.  He has eliminated many of his processed carbohydrates,and red meats.  It is possible patient has decreased his caloric intake that is causing his feeling of dizziness and weakness with the significant increase in physical activity.  He is neurologically intact in the office today.  Recommend increasing healthy calories, more beans, for fiber and protein.  Will get CBC, A1c, CMP, lipid panel, and vitamin B12 and folate levels.

## 2022-08-18 ENCOUNTER — Ambulatory Visit (HOSPITAL_BASED_OUTPATIENT_CLINIC_OR_DEPARTMENT_OTHER): Payer: Self-pay

## 2022-08-18 DIAGNOSIS — Z87898 Personal history of other specified conditions: Secondary | ICD-10-CM

## 2022-08-18 DIAGNOSIS — R634 Abnormal weight loss: Secondary | ICD-10-CM

## 2022-08-19 LAB — COMPREHENSIVE METABOLIC PANEL
ALT: 27 IU/L (ref 0–44)
AST: 27 IU/L (ref 0–40)
Albumin/Globulin Ratio: 1.6 (ref 1.2–2.2)
Albumin: 4.3 g/dL (ref 4.1–5.1)
Alkaline Phosphatase: 84 IU/L (ref 44–121)
BUN/Creatinine Ratio: 17 (ref 9–20)
BUN: 15 mg/dL (ref 6–24)
Bilirubin Total: 0.3 mg/dL (ref 0.0–1.2)
CO2: 22 mmol/L (ref 20–29)
Calcium: 9.4 mg/dL (ref 8.7–10.2)
Chloride: 104 mmol/L (ref 96–106)
Creatinine, Ser: 0.9 mg/dL (ref 0.76–1.27)
Globulin, Total: 2.7 g/dL (ref 1.5–4.5)
Glucose: 81 mg/dL (ref 70–99)
Potassium: 4.3 mmol/L (ref 3.5–5.2)
Sodium: 142 mmol/L (ref 134–144)
Total Protein: 7 g/dL (ref 6.0–8.5)
eGFR: 109 mL/min/{1.73_m2} (ref >59)

## 2022-08-19 LAB — CBC WITH DIFFERENTIAL/PLATELET
Basophils Absolute: 0.1 10*3/uL (ref 0.0–0.2)
Basos: 1 %
EOS (ABSOLUTE): 1.1 10*3/uL — ABNORMAL HIGH (ref 0.0–0.4)
Eos: 14 %
Hematocrit: 42.4 % (ref 37.5–51.0)
Hemoglobin: 13.5 g/dL (ref 13.0–17.7)
Immature Grans (Abs): 0 10*3/uL (ref 0.0–0.1)
Immature Granulocytes: 0 %
Lymphocytes Absolute: 4 10*3/uL — ABNORMAL HIGH (ref 0.7–3.1)
Lymphs: 51 %
MCH: 28.1 pg (ref 26.6–33.0)
MCHC: 31.8 g/dL (ref 31.5–35.7)
MCV: 88 fL (ref 79–97)
Monocytes Absolute: 0.6 10*3/uL (ref 0.1–0.9)
Monocytes: 8 %
Neutrophils Absolute: 2 10*3/uL (ref 1.4–7.0)
Neutrophils: 26 %
Platelets: 343 10*3/uL (ref 150–450)
RBC: 4.8 x10E6/uL (ref 4.14–5.80)
RDW: 13.3 % (ref 11.6–15.4)
WBC: 7.8 10*3/uL (ref 3.4–10.8)

## 2022-08-19 LAB — B12 AND FOLATE PANEL
Folate: 15.8 ng/mL (ref >3.0)
Vitamin B-12: 638 pg/mL (ref 232–1245)

## 2022-08-19 LAB — HEMOGLOBIN A1C
Est. average glucose Bld gHb Est-mCnc: 126 mg/dL
Hgb A1c MFr Bld: 6 % — ABNORMAL HIGH (ref 4.8–5.6)

## 2022-08-19 LAB — LIPID PANEL
Chol/HDL Ratio: 3.2 ratio (ref 0.0–5.0)
Cholesterol, Total: 165 mg/dL (ref 100–199)
HDL: 51 mg/dL (ref >39)
LDL Chol Calc (NIH): 93 mg/dL (ref 0–99)
Triglycerides: 115 mg/dL (ref 0–149)
VLDL Cholesterol Cal: 21 mg/dL (ref 5–40)

## 2022-12-01 ENCOUNTER — Ambulatory Visit (HOSPITAL_BASED_OUTPATIENT_CLINIC_OR_DEPARTMENT_OTHER): Payer: Self-pay | Admitting: Family Medicine

## 2022-12-08 ENCOUNTER — Ambulatory Visit (INDEPENDENT_AMBULATORY_CARE_PROVIDER_SITE_OTHER): Payer: Self-pay | Admitting: Family Medicine

## 2022-12-08 ENCOUNTER — Encounter (HOSPITAL_BASED_OUTPATIENT_CLINIC_OR_DEPARTMENT_OTHER): Payer: Self-pay | Admitting: Family Medicine

## 2022-12-08 VITALS — BP 131/86 | HR 66 | Temp 97.8°F | Ht 64.0 in | Wt 158.4 lb

## 2022-12-08 DIAGNOSIS — E785 Hyperlipidemia, unspecified: Secondary | ICD-10-CM

## 2022-12-08 DIAGNOSIS — Z0000 Encounter for general adult medical examination without abnormal findings: Secondary | ICD-10-CM

## 2022-12-08 DIAGNOSIS — R7303 Prediabetes: Secondary | ICD-10-CM

## 2022-12-08 NOTE — Assessment & Plan Note (Signed)
As above, patient has been making lifestyle modifications including adjustments to diet and increase in weekly physical activity.  He did have hemoglobin A1c checked a few months ago and that this had improved from initial assessment.  A1c still within prediabetes range but lower at 6.0%. Given positive progress so far, can continue with current lifestyle modifications and we will recheck hemoglobin A1c before next appointment to monitor

## 2022-12-08 NOTE — Patient Instructions (Signed)
  Medication Instructions:  Your physician recommends that you continue on your current medications as directed. Please refer to the Current Medication list given to you today. --If you need a refill on any your medications before your next appointment, please call your pharmacy first. If no refills are authorized on file call the office.-- Lab Work: Your physician has recommended that you have lab work today: No If you have labs (blood work) drawn today and your tests are completely normal, you will receive your results via MyChart message OR a phone call from our staff.  Please ensure you check your voicemail in the event that you authorized detailed messages to be left on a delegated number. If you have any lab test that is abnormal or we need to change your treatment, we will call you to review the results.  Referrals/Procedures/Imaging: No  Follow-Up: Your next appointment:   Your physician recommends that you schedule a follow-up appointment in 6 months with Dr. de Cuba.  You will receive a text message or e-mail with a link to a survey about your care and experience with us today! We would greatly appreciate your feedback!   Thanks for letting us be apart of your health journey!!  Primary Care and Sports Medicine   Dr. Raymond de Cuba   We encourage you to activate your patient portal called "MyChart".  Sign up information is provided on this After Visit Summary.  MyChart is used to connect with patients for Virtual Visits (Telemedicine).  Patients are able to view lab/test results, encounter notes, upcoming appointments, etc.  Non-urgent messages can be sent to your provider as well. To learn more about what you can do with MyChart, please visit --  [email or web address].    

## 2022-12-08 NOTE — Progress Notes (Signed)
    Procedures performed today:    None.  Independent interpretation of notes and tests performed by another provider:   None.  Brief History, Exam, Impression, and Recommendations:    BP 131/86 (BP Location: Left Arm, Patient Position: Sitting, Cuff Size: Normal)   Pulse 66   Temp 97.8 F (36.6 C) (Oral)   Ht 5\' 4"  (1.626 m)   Wt 158 lb 6.4 oz (71.8 kg)   SpO2 100%   BMI 27.19 kg/m   Hyperlipidemia Patient has been progressing on aggressive lifestyle modifications, has made changes to diet as well as exercise.  Did have cholesterol panel completed a few months ago this was notably improved from prior evaluation.  Discussed this with patient and encouraged to continue with lifestyle modification  Prediabetes As above, patient has been making lifestyle modifications including adjustments to diet and increase in weekly physical activity.  He did have hemoglobin A1c checked a few months ago and that this had improved from initial assessment.  A1c still within prediabetes range but lower at 6.0%. Given positive progress so far, can continue with current lifestyle modifications and we will recheck hemoglobin A1c before next appointment to monitor  Return in about 6 months (around 06/09/2023) for CPE with FBW 1 week prior.   ___________________________________________ Mahamadou Weltz de [place], MD, ABFM, CAQSM Primary Care and Sports Medicine [facility]

## 2022-12-08 NOTE — Assessment & Plan Note (Signed)
Patient has been progressing on aggressive lifestyle modifications, has made changes to diet as well as exercise.  Did have cholesterol panel completed a few months ago this was notably improved from prior evaluation.  Discussed this with patient and encouraged to continue with lifestyle modification

## 2023-02-15 ENCOUNTER — Encounter (HOSPITAL_COMMUNITY): Payer: Self-pay | Admitting: Emergency Medicine

## 2023-02-15 ENCOUNTER — Ambulatory Visit (HOSPITAL_COMMUNITY)
Admission: EM | Admit: 2023-02-15 | Discharge: 2023-02-15 | Disposition: A | Discharge status: Home / Self Care | Payer: Self-pay | Attending: Internal Medicine | Admitting: Internal Medicine

## 2023-02-15 ENCOUNTER — Other Ambulatory Visit: Payer: Self-pay

## 2023-02-15 DIAGNOSIS — H60391 Other infective otitis externa, right ear: Secondary | ICD-10-CM

## 2023-02-15 MED ORDER — OFLOXACIN 0.3 % OT SOLN: 10.0000 [drp] | Freq: Every day | OTIC | 0 refills | Status: DC

## 2023-02-15 NOTE — Discharge Instructions (Signed)
You have an ear infection of the ear canal known as otitis externa. Use ear drops as prescribed for 7 days. Do not place anything smaller than elbow deep into ear canal- this includes Q-tips. You may place a small amount of rubbing alcohol onto the end of a Q-tip and place this into the outer ear canal to dry up any remaining water that may have gotten into the ear while showering or submerging head underwater to prevent this type of infection in the future.

## 2023-02-15 NOTE — ED Triage Notes (Signed)
Pt c/o right side ear pain x 3 days.

## 2023-02-15 NOTE — ED Provider Notes (Signed)
[facility]    CSN: [id number] Arrival date & time: 02/15/23  1517      History   Chief Complaint Chief Complaint  Patient presents with   Otalgia    HPI Brandon Irwin is a 42 y.o. male.   Patient presents to urgent care for evaluation of right ear pain that started a couple of days ago.  He frequently wears earplugs at work in [employer] and states that he mowed the yard the other day without the earplugs in.  He is concerned that this may have caused ear pain.  He has not noted any drainage from the ear canal.  No recent fevers, chills, viral URI symptoms, headache, tinnitus, decreased hearing, or trauma/injury to the ear.  He does not use Q-tips.  Denies recent antibiotic or steroid use.  Has not attempted treatment of symptoms prior to arrival urgent care.   Otalgia   History reviewed. No pertinent past medical history.  Patient Active Problem List   Diagnosis Date Noted   Recent weight loss 08/16/2022   History of dizziness 08/16/2022   Wellness examination 06/02/2022   Prediabetes 06/02/2022   Hyperlipidemia 06/02/2022   Asthma 04/14/2022   Low back pain 04/14/2022    History reviewed. No pertinent surgical history.     Home Medications    Prior to Admission medications   Medication Sig Start Date End Date Taking? Authorizing Provider  ofloxacin (FLOXIN) 0.3 % OTIC solution Place 10 drops into the right ear daily. 02/15/23  Yes StanhopeDonavan Burnet, FNP    Family History Family History  Problem Relation Age of Onset   Cancer Neg Hx    Diabetes Neg Hx    Heart failure Neg Hx     Social History Social History   Tobacco Use   Smoking status: Never   Smokeless tobacco: Never  Substance Use Topics   Alcohol use: Yes    Alcohol/week: 0.0 standard drinks of alcohol   Drug use: No     Allergies   Patient has no known allergies.   Review of Systems Review of Systems  HENT:  Positive for ear pain.   Per HPI   Physical  Exam Triage Vital Signs ED Triage Vitals  Encounter Vitals Group     BP 02/15/23 1534 117/83     Systolic BP Percentile --      Diastolic BP Percentile --      Pulse Rate 02/15/23 1534 (!) 59     Resp 02/15/23 1534 16     Temp 02/15/23 1534 98 F (36.7 C)     Temp Source 02/15/23 1534 Oral     SpO2 02/15/23 1534 99 %     Weight 02/15/23 1535 158 lb 11.7 oz (72 kg)     Height 02/15/23 1535 5\' 4"  (1.626 m)     Head Circumference --      Peak Flow --      Pain Score 02/15/23 1535 8     Pain Loc --      Pain Education --      Exclude from Growth Chart --    No data found.  Updated Vital Signs BP 117/83 (BP Location: Right Arm)   Pulse (!) 59   Temp 98 F (36.7 C) (Oral)   Resp 16   Ht 5\' 4"  (1.626 m)   Wt 158 lb 11.7 oz (72 kg)   SpO2 99%   BMI 27.25 kg/m   Visual Acuity Right Eye Distance:  Left Eye Distance:   Bilateral Distance:    Right Eye Near:   Left Eye Near:    Bilateral Near:     Physical Exam Vitals and nursing note reviewed.  Constitutional:      Appearance: He is not ill-appearing or toxic-appearing.  HENT:     Head: Normocephalic and atraumatic.     Right Ear: Hearing and external ear normal. Drainage, swelling and tenderness present. There is no impacted cerumen.     Left Ear: Hearing, tympanic membrane, ear canal and external ear normal. There is no impacted cerumen.     Ears:     Comments: Copious thick purulent drainage to the right ear canal, this obstructs view of the right tympanic membrane.    Nose: Nose normal.     Mouth/Throat:     Lips: Pink.  Eyes:     General: Lids are normal. Vision grossly intact. Gaze aligned appropriately.     Extraocular Movements: Extraocular movements intact.     Conjunctiva/sclera: Conjunctivae normal.  Cardiovascular:     Rate and Rhythm: Normal rate and regular rhythm.     Heart sounds: Normal heart sounds, S1 normal and S2 normal.  Pulmonary:     Effort: Pulmonary effort is normal. No respiratory  distress.     Breath sounds: Normal breath sounds and air entry.  Musculoskeletal:     Cervical back: Neck supple.  Skin:    General: Skin is warm and dry.     Capillary Refill: Capillary refill takes less than 2 seconds.     Findings: No rash.  Neurological:     General: No focal deficit present.     Mental Status: He is alert and oriented to person, place, and time. Mental status is at baseline.     Cranial Nerves: No dysarthria or facial asymmetry.  Psychiatric:        Mood and Affect: Mood normal.        Speech: Speech normal.        Behavior: Behavior normal.        Thought Content: Thought content normal.        Judgment: Judgment normal.      UC Treatments / Results  Labs (all labs ordered are listed, but only abnormal results are displayed) Labs Reviewed - No data to display  EKG   Radiology No results found.  Procedures Procedures (including critical care time)  Medications Ordered in UC Medications - No data to display  Initial Impression / Assessment and Plan / UC Course  I have reviewed the triage vital signs and the nursing notes.  Pertinent labs & imaging results that were available during my care of the patient were reviewed by me and considered in my medical decision making (see chart for details).   1.  Other infective acute otitis externa of right ear Presentation consistent with otitis externa. Will manage this with ofloxacin otic drops as prescribed for 7 days since I am unable to see the eardrum of the affected ear. Encouraged to avoid getting water into affected ear(s) for at least 7-10 days. Over the counter medications as needed for pain.   Counseled patient on potential for adverse effects with medications prescribed/recommended today, strict ER and return-to-clinic precautions discussed, patient verbalized understanding.    Final Clinical Impressions(s) / UC Diagnoses   Final diagnoses:  Other infective acute otitis externa of right ear      Discharge Instructions      You have an ear infection  of the ear canal known as otitis externa. Use ear drops as prescribed for 7 days. Do not place anything smaller than elbow deep into ear canal- this includes Q-tips. You may place a small amount of rubbing alcohol onto the end of a Q-tip and place this into the outer ear canal to dry up any remaining water that may have gotten into the ear while showering or submerging head underwater to prevent this type of infection in the future.      ED Prescriptions     Medication Sig Dispense Auth. Provider   ofloxacin (FLOXIN) 0.3 % OTIC solution Place 10 drops into the right ear daily. 5 mL Carlisle Beers, FNP      PDMP not reviewed this encounter.   Carlisle Beers, [place] 02/15/23 [phone]

## 2023-04-05 ENCOUNTER — Ambulatory Visit (HOSPITAL_COMMUNITY)
Admission: EM | Admit: 2023-04-05 | Discharge: 2023-04-05 | Disposition: A | Discharge status: Home / Self Care | Payer: Self-pay | Attending: Emergency Medicine | Admitting: Emergency Medicine

## 2023-04-05 ENCOUNTER — Encounter (HOSPITAL_COMMUNITY): Payer: Self-pay

## 2023-04-05 DIAGNOSIS — H60501 Unspecified acute noninfective otitis externa, right ear: Secondary | ICD-10-CM

## 2023-04-05 HISTORY — DX: Essential (primary) hypertension: I10

## 2023-04-05 HISTORY — DX: Pure hypercholesterolemia, unspecified: E78.00

## 2023-04-05 MED ORDER — CIPROFLOXACIN-DEXAMETHASONE 0.3-0.1 % OT SUSP: 4.0000 [drp] | Freq: Two times a day (BID) | OTIC | 0 refills | Status: AC

## 2023-04-05 MED ORDER — AMOXICILLIN-POT CLAVULANATE 875-125 MG PO TABS: 1.0000 | ORAL_TABLET | Freq: Two times a day (BID) | ORAL | 0 refills | Status: DC

## 2023-04-05 NOTE — ED Triage Notes (Addendum)
Patient was seen on 02/15/23 for right ear pain and received Ofloxacin. Patient states when he first started using the ear gtts he felt better and now "I feel deaf and just like I was earlier."   Per [employer] # [phone]

## 2023-04-05 NOTE — ED Provider Notes (Signed)
[facility]    CSN: [id number] Arrival date & time: 04/05/23  1526      History   Chief Complaint Chief Complaint  Patient presents with   Otalgia    HPI Brandon Irwin is a 42 y.o. male. He was seen here in urgent care in August for an ear infection. Records review show he was treated with oxfloxacin for left otitis externa. He stopped using ear plugs and now uses headphone-type ear protection that fits over his ears. Also purchased tetracycline antibiotics on his own and took them. He reports his ear symptoms improved a little with these treatments but never completely healed and are now much worse. He has been using OTC ear wax drops for the last week for no relief. R ear is fine, no symptoms.   This visit was conducted with the help of a video spanish interpreter.      Otalgia   Past Medical History:  Diagnosis Date   High cholesterol    Hypertension     Patient Active Problem List   Diagnosis Date Noted   Recent weight loss 08/16/2022   History of dizziness 08/16/2022   Wellness examination 06/02/2022   Prediabetes 06/02/2022   Hyperlipidemia 06/02/2022   Asthma 04/14/2022   Low back pain 04/14/2022    History reviewed. No pertinent surgical history.     Home Medications    Prior to Admission medications   Medication Sig Start Date End Date Taking? Authorizing Provider  amoxicillin-clavulanate (AUGMENTIN) 875-125 MG tablet Take 1 tablet by mouth 2 (two) times daily. 04/05/23  Yes Cathlyn Parsons, NP  ciprofloxacin-dexamethasone (CIPRODEX) OTIC suspension Place 4 drops into the right ear 2 (two) times daily for 7 days. 04/05/23 04/12/23 Yes Cathlyn Parsons, NP    Family History Family History  Problem Relation Age of Onset   Cancer Neg Hx    Diabetes Neg Hx    Heart failure Neg Hx     Social History Social History   Tobacco Use   Smoking status: Never   Smokeless tobacco: Never  Vaping Use   Vaping status: Never Used  Substance Use  Topics   Alcohol use: Yes    Alcohol/week: 0.0 standard drinks of alcohol   Drug use: No     Allergies   Patient has no known allergies.   Review of Systems Review of Systems  HENT:  Positive for ear pain.      Physical Exam Triage Vital Signs ED Triage Vitals  Encounter Vitals Group     BP 04/05/23 1608 128/77     Systolic BP Percentile --      Diastolic BP Percentile --      Pulse Rate 04/05/23 1608 (!) 57     Resp 04/05/23 1608 16     Temp 04/05/23 1608 98.2 F (36.8 C)     Temp Source 04/05/23 1608 Oral     SpO2 04/05/23 1608 96 %     Weight --      Height --      Head Circumference --      Peak Flow --      Pain Score 04/05/23 1612 7     Pain Loc --      Pain Education --      Exclude from Growth Chart --    No data found.  Updated Vital Signs BP 128/77 (BP Location: Left Arm)   Pulse (!) 57   Temp 98.2 F (36.8 C) (Oral)  Resp 16   SpO2 96%   Visual Acuity Right Eye Distance:   Left Eye Distance:   Bilateral Distance:    Right Eye Near:   Left Eye Near:    Bilateral Near:     Physical Exam Constitutional:      Appearance: Normal appearance.  HENT:     Right Ear: Tympanic membrane, ear canal and external ear normal.     Left Ear: Drainage, swelling and tenderness present. No mastoid tenderness.     Ears:     Comments: L ear mild pain with movement of pinna and palpation of tragus. L ear canal with swelling and TM is obscured by purulent drainage.  Neurological:     Mental Status: He is alert.      UC Treatments / Results  Labs (all labs ordered are listed, but only abnormal results are displayed) Labs Reviewed - No data to display  EKG   Radiology No results found.  Procedures Procedures (including critical care time)  Medications Ordered in UC Medications - No data to display  Initial Impression / Assessment and Plan / UC Course  I have reviewed the triage vital signs and the nursing notes.  Pertinent labs & imaging  results that were available during my care of the patient were reviewed by me and considered in my medical decision making (see chart for details).    Given infection from August never completely healed, I rx augmentin and ciprodex ear drops. Pt will need to have ear rechecked after he completes treatment to ensure infection is completely healed.   Final Clinical Impressions(s) / UC Diagnoses   Final diagnoses:  Acute otitis externa of right ear, unspecified type     Discharge Instructions      Have your ear rechecked after you finish all of the pill antibiotics in one week. You can make an appointment on the [facility] website so you don't have to wait too long to have it rechecked.    ED Prescriptions     Medication Sig Dispense Auth. Provider   ciprofloxacin-dexamethasone (CIPRODEX) OTIC suspension Place 4 drops into the right ear 2 (two) times daily for 7 days. 7.5 mL Cathlyn Parsons, NP   amoxicillin-clavulanate (AUGMENTIN) 875-125 MG tablet Take 1 tablet by mouth 2 (two) times daily. 14 tablet Cathlyn Parsons, NP      PDMP not reviewed this encounter.   Cathlyn Parsons, NP 04/05/23 1701

## 2023-04-05 NOTE — Discharge Instructions (Signed)
Have your ear rechecked after you finish all of the pill antibiotics in one week. You can make an appointment on the [facility] website so you don't have to wait too long to have it rechecked.

## 2023-05-26 ENCOUNTER — Other Ambulatory Visit (HOSPITAL_BASED_OUTPATIENT_CLINIC_OR_DEPARTMENT_OTHER): Payer: Self-pay

## 2023-05-30 ENCOUNTER — Other Ambulatory Visit (HOSPITAL_BASED_OUTPATIENT_CLINIC_OR_DEPARTMENT_OTHER): Payer: Self-pay | Admitting: Family Medicine

## 2023-05-30 ENCOUNTER — Other Ambulatory Visit (HOSPITAL_BASED_OUTPATIENT_CLINIC_OR_DEPARTMENT_OTHER): Payer: Self-pay

## 2023-05-30 DIAGNOSIS — Z0000 Encounter for general adult medical examination without abnormal findings: Secondary | ICD-10-CM

## 2023-05-31 LAB — CBC WITH DIFFERENTIAL/PLATELET
Basophils Absolute: 0.1 10*3/uL (ref 0.0–0.2)
Basos: 1 %
EOS (ABSOLUTE): 0.7 10*3/uL — ABNORMAL HIGH (ref 0.0–0.4)
Eos: 10 %
Hematocrit: 42.1 % (ref 37.5–51.0)
Hemoglobin: 13.4 g/dL (ref 13.0–17.7)
Immature Grans (Abs): 0 10*3/uL (ref 0.0–0.1)
Immature Granulocytes: 0 %
Lymphocytes Absolute: 3.8 10*3/uL — ABNORMAL HIGH (ref 0.7–3.1)
Lymphs: 55 %
MCH: 28.3 pg (ref 26.6–33.0)
MCHC: 31.8 g/dL (ref 31.5–35.7)
MCV: 89 fL (ref 79–97)
Monocytes Absolute: 0.5 10*3/uL (ref 0.1–0.9)
Monocytes: 8 %
Neutrophils Absolute: 1.8 10*3/uL (ref 1.4–7.0)
Neutrophils: 26 %
Platelets: 317 10*3/uL (ref 150–450)
RBC: 4.74 x10E6/uL (ref 4.14–5.80)
RDW: 13.1 % (ref 11.6–15.4)
WBC: 6.9 10*3/uL (ref 3.4–10.8)

## 2023-05-31 LAB — LIPID PANEL
Chol/HDL Ratio: 3.8 {ratio} (ref 0.0–5.0)
Cholesterol, Total: 208 mg/dL — ABNORMAL HIGH (ref 100–199)
HDL: 55 mg/dL (ref >39)
LDL Chol Calc (NIH): 137 mg/dL — ABNORMAL HIGH (ref 0–99)
Triglycerides: 92 mg/dL (ref 0–149)
VLDL Cholesterol Cal: 16 mg/dL (ref 5–40)

## 2023-05-31 LAB — COMPREHENSIVE METABOLIC PANEL
ALT: 45 [IU]/L — ABNORMAL HIGH (ref 0–44)
AST: 34 [IU]/L (ref 0–40)
Albumin: 4.4 g/dL (ref 4.1–5.1)
Alkaline Phosphatase: 78 [IU]/L (ref 44–121)
BUN/Creatinine Ratio: 18 (ref 9–20)
BUN: 17 mg/dL (ref 6–24)
Bilirubin Total: 0.2 mg/dL (ref 0.0–1.2)
CO2: 22 mmol/L (ref 20–29)
Calcium: 9.3 mg/dL (ref 8.7–10.2)
Chloride: 104 mmol/L (ref 96–106)
Creatinine, Ser: 0.95 mg/dL (ref 0.76–1.27)
Globulin, Total: 2.7 g/dL (ref 1.5–4.5)
Glucose: 88 mg/dL (ref 70–99)
Potassium: 4.5 mmol/L (ref 3.5–5.2)
Sodium: 139 mmol/L (ref 134–144)
Total Protein: 7.1 g/dL (ref 6.0–8.5)
eGFR: 102 mL/min/{1.73_m2} (ref >59)

## 2023-05-31 LAB — TSH RFX ON ABNORMAL TO FREE T4: TSH: 3.03 u[IU]/mL (ref 0.450–4.500)

## 2023-05-31 LAB — HEMOGLOBIN A1C
Est. average glucose Bld gHb Est-mCnc: 128 mg/dL
Hgb A1c MFr Bld: 6.1 % — ABNORMAL HIGH (ref 4.8–5.6)

## 2023-06-09 ENCOUNTER — Ambulatory Visit (INDEPENDENT_AMBULATORY_CARE_PROVIDER_SITE_OTHER): Payer: Self-pay | Admitting: Family Medicine

## 2023-06-09 ENCOUNTER — Encounter (HOSPITAL_BASED_OUTPATIENT_CLINIC_OR_DEPARTMENT_OTHER): Payer: Self-pay | Admitting: Family Medicine

## 2023-06-09 VITALS — BP 107/83 | HR 58 | Ht 62.99 in | Wt 165.1 lb

## 2023-06-09 DIAGNOSIS — R209 Unspecified disturbances of skin sensation: Secondary | ICD-10-CM | POA: Insufficient documentation

## 2023-06-09 DIAGNOSIS — Z23 Encounter for immunization: Secondary | ICD-10-CM

## 2023-06-09 DIAGNOSIS — Z0000 Encounter for general adult medical examination without abnormal findings: Secondary | ICD-10-CM

## 2023-06-09 NOTE — Assessment & Plan Note (Signed)
Patient has noticed cold feet which will primarily occur at night.  He indicates that he notices when he resumed more regular alcohol consumption, he had quit drinking previously.  On noticing this as well as concerns with how alcohol could be affecting other chronic medical issues, he has since stopped consuming alcohol again.  He primarily was noticing cold feet which would occur at night, has not had any other associated symptoms such as numbness, tingling, pain, discoloration.  Does not typically have significant daytime symptoms.  No symptoms noted with increased activity. On exam, feet are normal in appearance, normal PT and DP pulses bilaterally.  Normal sensation throughout both feet.  No changes in skin appearance or coloration. Uncertain cause for observed symptoms.  Recent labs without any obvious correlation or explanation for cold feet.  Discussed options including monitoring at this time for possible further evaluation with vascular assessment.  If proceeding with vascular assessment, would initially complete ABIs.  After discussion, patient elected to proceed with monitoring for now

## 2023-06-09 NOTE — Progress Notes (Signed)
Subjective:    CC: Annual Physical Exam  HPI:  Brandon Irwin is a 42 y.o. presenting for annual physical  I reviewed the past medical history, family history, social history, surgical history, and allergies today and no changes were needed.  Please see the problem list section below in epic for further details.  Past Medical History: Past Medical History:  Diagnosis Date   High cholesterol    Hypertension    Past Surgical History: History reviewed. No pertinent surgical history. Social History: Social History   Socioeconomic History   Marital status: Significant Other    Spouse name: Not on file   Number of children: Not on file   Years of education: Not on file   Highest education level: Not on file  Occupational History   Not on file  Tobacco Use   Smoking status: Never    Passive exposure: Never   Smokeless tobacco: Never  Vaping Use   Vaping status: Never Used  Substance and Sexual Activity   Alcohol use: Yes    Alcohol/week: 0.0 standard drinks of alcohol   Drug use: No   Sexual activity: Yes  Other Topics Concern   Not on file  Social History Narrative   Not on file   Social Determinants of Health   Financial Resource Strain: Not on file  Food Insecurity: Not on file  Transportation Needs: Not on file  Physical Activity: Not on file  Stress: Not on file  Social Connections: Not on file   Family History: Family History  Problem Relation Age of Onset   Cancer Neg Hx    Diabetes Neg Hx    Heart failure Neg Hx    Allergies: No Known Allergies Medications: See med rec.  Review of Systems: No headache, visual changes, nausea, vomiting, diarrhea, constipation, dizziness, abdominal pain, skin rash, fevers, chills, night sweats, swollen lymph nodes, weight loss, chest pain, body aches, joint swelling, muscle aches, shortness of breath, mood changes, visual or auditory hallucinations.  Objective:    BP 107/83 (BP Location: Right Arm, Patient  Position: Sitting, Cuff Size: Normal)   Pulse (!) 58   Ht 5' 2.99" (1.6 m)   Wt 165 lb 1.6 oz (74.9 kg)   SpO2 100%   BMI 29.25 kg/m   General: Well Developed, well nourished, and in no acute distress. Neuro: Alert and oriented x3, extra-ocular muscles intact, sensation grossly intact. Cranial nerves II through XII are intact, motor, sensory, and coordinative functions are all intact. HEENT: Normocephalic, atraumatic, pupils equal round reactive to light, neck supple, no masses, no lymphadenopathy, thyroid nonpalpable. Oropharynx, nasopharynx, external ear canals are unremarkable. Skin: Warm and dry, no rashes noted. Cardiac: Regular rate and rhythm, no murmurs rubs or gallops. Respiratory: Clear to auscultation bilaterally. Not using accessory muscles, speaking in full sentences. Abdominal: Soft, nontender, nondistended, positive bowel sounds, no masses, no organomegaly. Musculoskeletal: Shoulder, elbow, wrist, hip, knee, ankle stable, and with full range of motion.  Impression and Recommendations:    Wellness examination Assessment & Plan: Routine HCM labs reviewed. HCM reviewed/discussed. Anticipatory guidance regarding healthy weight, lifestyle and choices given. Recommend healthy diet.  Recommend approximately 150 minutes/week of moderate intensity exercise Recommend regular dental and vision exams Always use seatbelt/lap and shoulder restraints Recommend using smoke alarms and checking batteries at least twice a year Recommend using sunscreen when outside Discussed tetanus immunization recommendations, patient is UTD Recommend seasonal influenza vaccine, patient amenable, administered today   Cold feet Assessment & Plan: Patient has noticed cold  feet which will primarily occur at night.  He indicates that he notices when he resumed more regular alcohol consumption, he had quit drinking previously.  On noticing this as well as concerns with how alcohol could be affecting other  chronic medical issues, he has since stopped consuming alcohol again.  He primarily was noticing cold feet which would occur at night, has not had any other associated symptoms such as numbness, tingling, pain, discoloration.  Does not typically have significant daytime symptoms.  No symptoms noted with increased activity. On exam, feet are normal in appearance, normal PT and DP pulses bilaterally.  Normal sensation throughout both feet.  No changes in skin appearance or coloration. Uncertain cause for observed symptoms.  Recent labs without any obvious correlation or explanation for cold feet.  Discussed options including monitoring at this time for possible further evaluation with vascular assessment.  If proceeding with vascular assessment, would initially complete ABIs.  After discussion, patient elected to proceed with monitoring for now   Encounter for immunization -     Flu vaccine trivalent PF, 6mos and older(Flulaval,Afluria,Fluarix,Fluzone)  Return in about 3 months (around 09/07/2023) for cold feet.   ___________________________________________ Samir Ishaq de [place], MD, ABFM, CAQSM Primary Care and Sports Medicine [facility]

## 2023-06-09 NOTE — Assessment & Plan Note (Signed)
Routine HCM labs reviewed. HCM reviewed/discussed. Anticipatory guidance regarding healthy weight, lifestyle and choices given. Recommend healthy diet.  Recommend approximately 150 minutes/week of moderate intensity exercise Recommend regular dental and vision exams Always use seatbelt/lap and shoulder restraints Recommend using smoke alarms and checking batteries at least twice a year Recommend using sunscreen when outside Discussed tetanus immunization recommendations, patient is UTD Recommend seasonal influenza vaccine, patient amenable, administered today

## 2023-08-28 IMAGING — CT CT HEAD W/O CM
4 series · 16 of 47 positions shown, 18 images · non-contrast
Comparison: 06/25/2021

CLINICAL DATA: Head trauma, abnormal mental status (Age 18-64y).
Fall.



[Series 3: head without · axial · non-contrast · 0.39mm/px · z∈[+617,+737]mm · 7 of 33 slices shown, 9 images]
[im 5/33  brain]
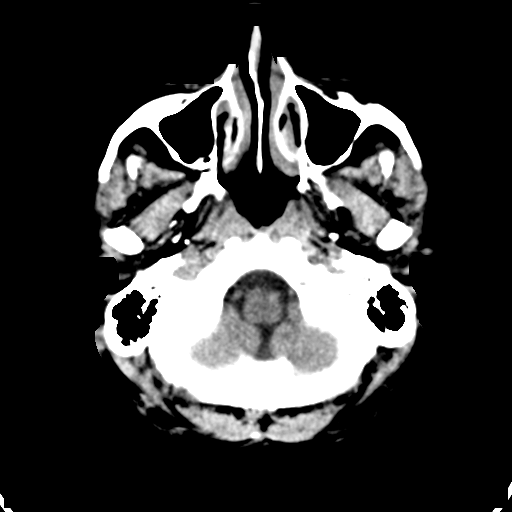
[im 5/33  bone]
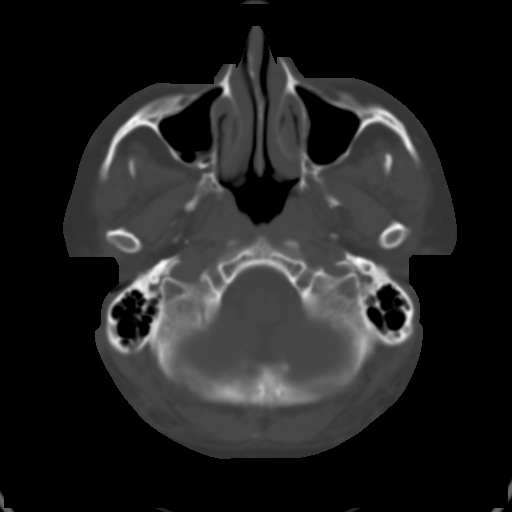
[im 9/33  brain]
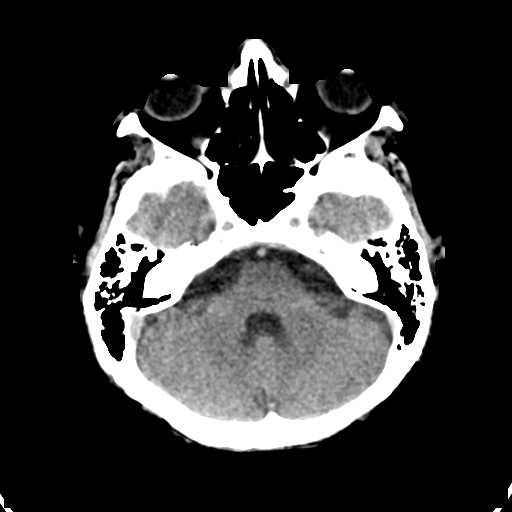
[im 13/33  brain]
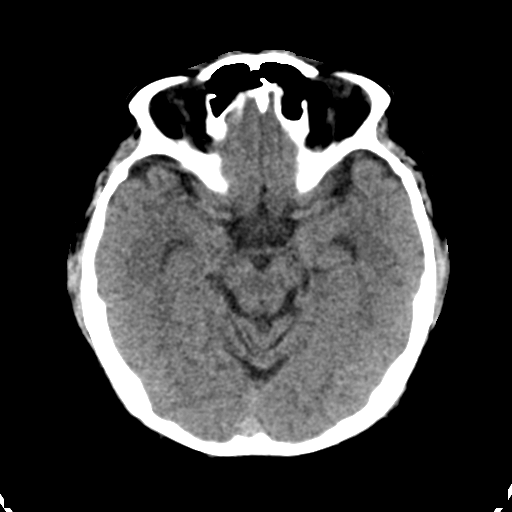
[im 17/33  brain]
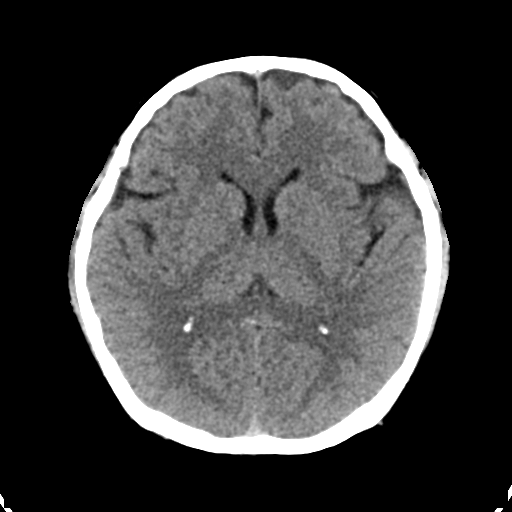
[im 21/33  brain]
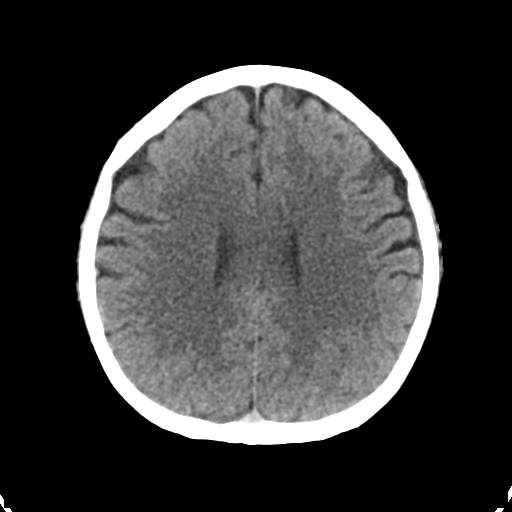
[im 21/33  bone]
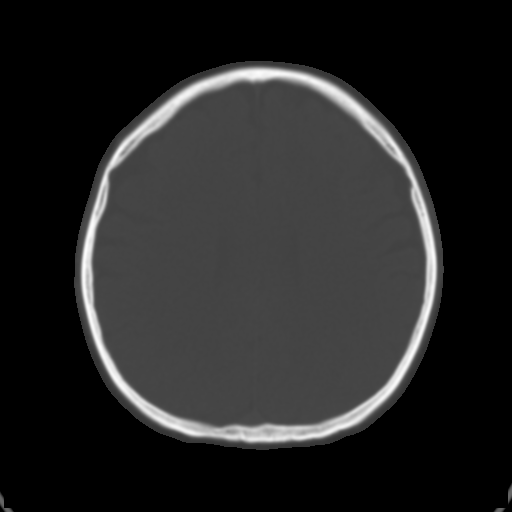
[im 25/33  brain]
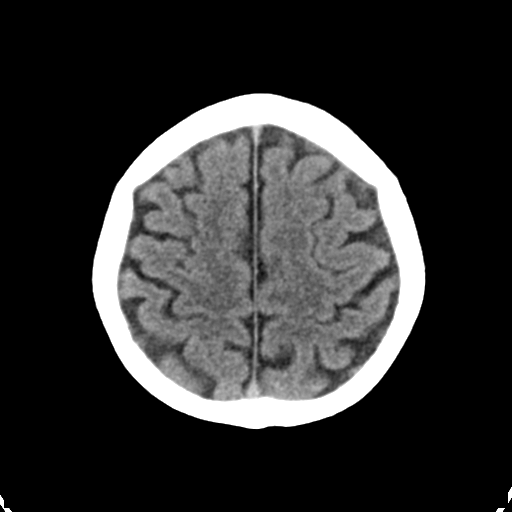
[im 29/33  brain]
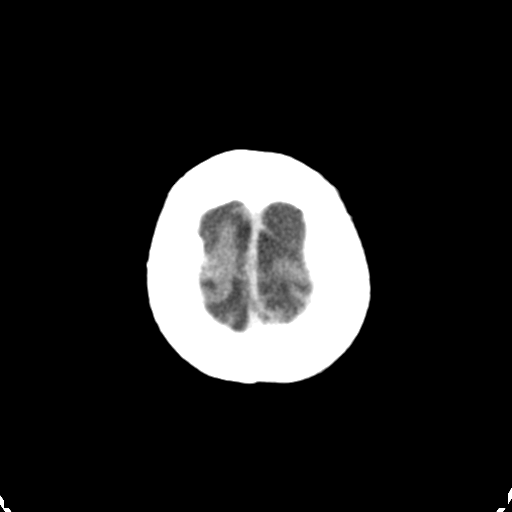

[Series 4: head bone · axial · 0.39mm/px · z∈[+613,+645]mm · 3 of 82 slices shown]
[im 9/82  bone]
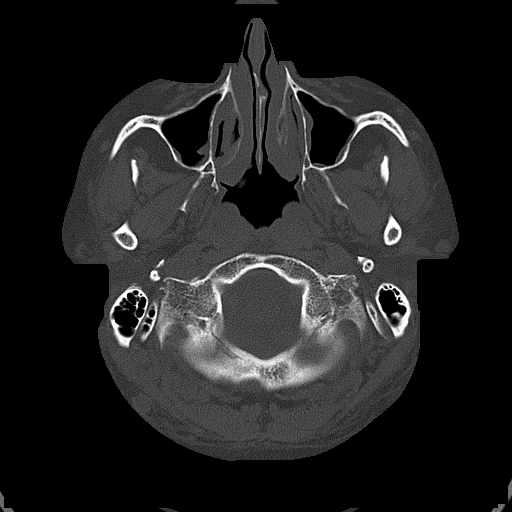
[im 17/82  bone]
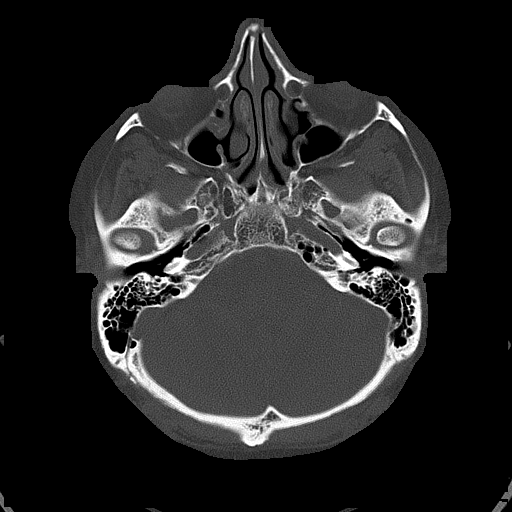
[im 25/82  bone]
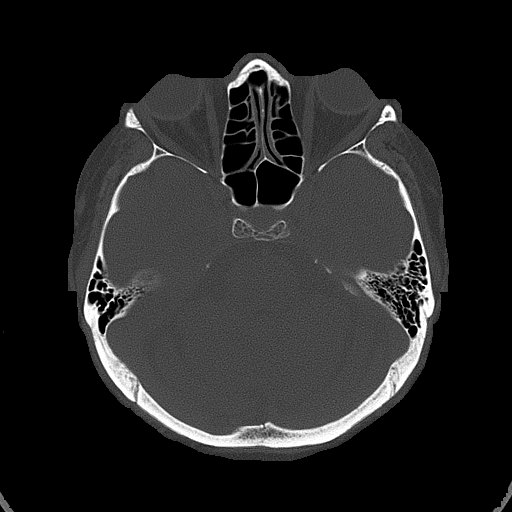

[Series 5: head without cor · coronal · non-contrast · 0.35mm/px · 3 of 66 slices shown]
[im 22/66  brain]
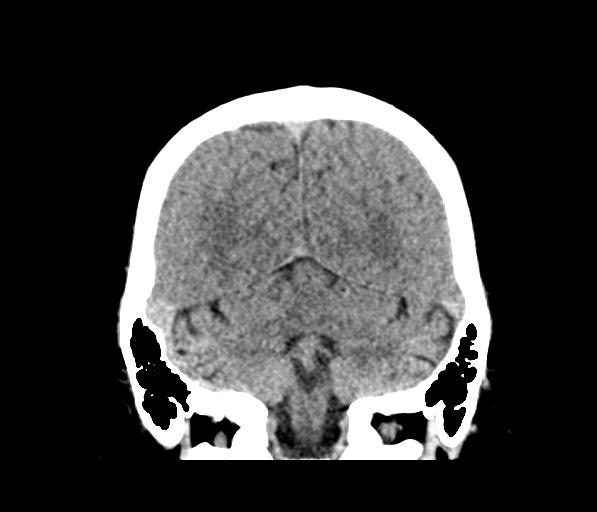
[im 29/66  brain]
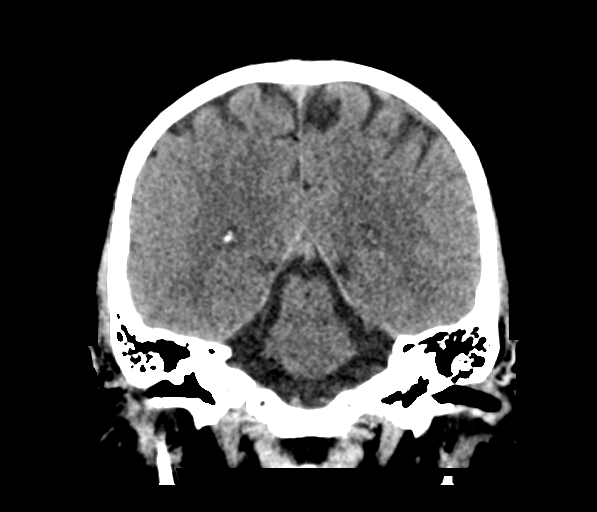
[im 37/66  brain]
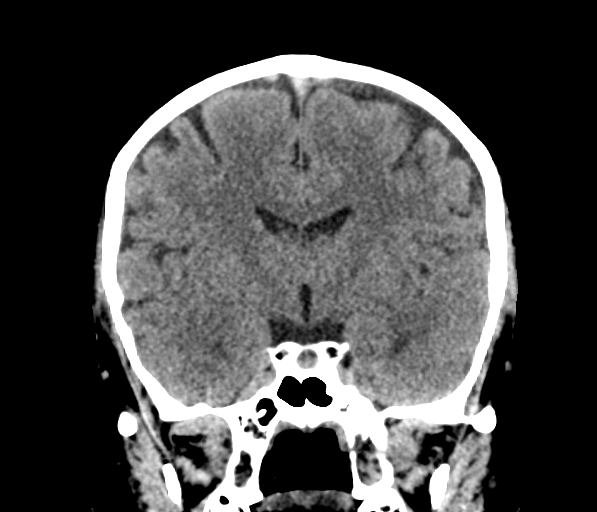

[Series 6: head without sag · sagittal · non-contrast · 0.36mm/px · 3 of 59 slices shown]
[im 20/59  brain]
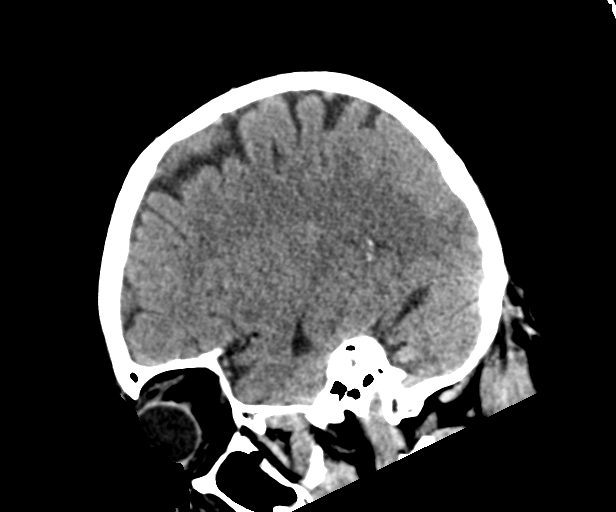
[im 30/59  brain]
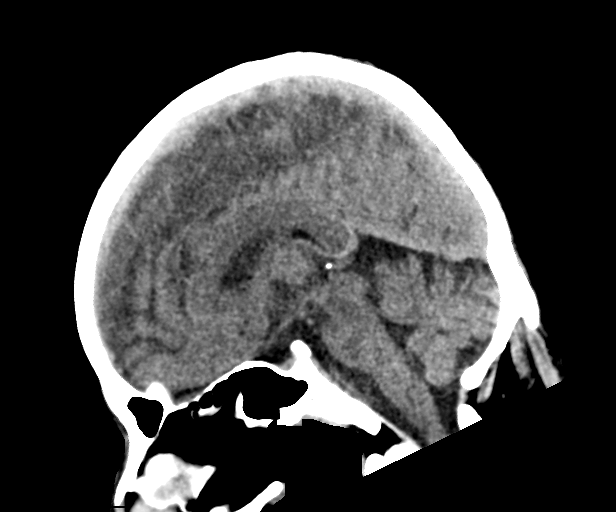
[im 39/59  brain]
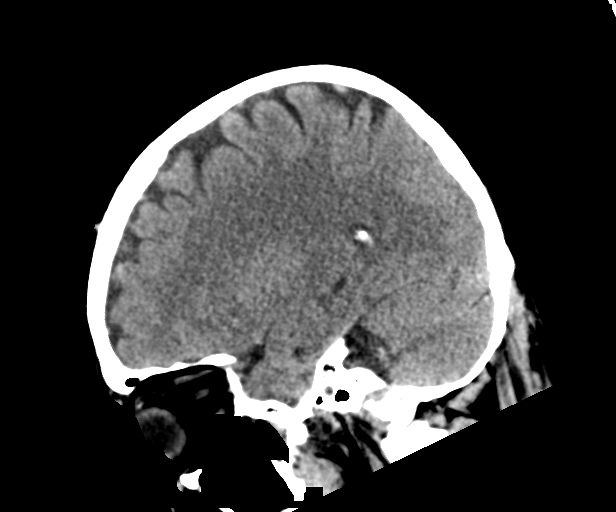

[16 of 47 positions shown; findings below may reference images not displayed]

FINDINGS: Brain: No acute intracranial abnormality. Specifically, no
hemorrhage, hydrocephalus, mass lesion, acute infarction, or
significant intracranial injury.

Vascular: No hyperdense vessel or unexpected calcification.

Skull: No acute calvarial abnormality.

Sinuses/Orbits: No acute findings

Other: None
IMPRESSION: No acute intracranial abnormality.

## 2023-08-29 IMAGING — CR DG FOREARM 2V*R*
2 series · 2 of 2 positions shown · non-contrast
Comparison: None Available.

CLINICAL DATA: Laceration, question foreign body

EXAM:
RIGHT FOREARM - 2 VIEW

[forearm ap]
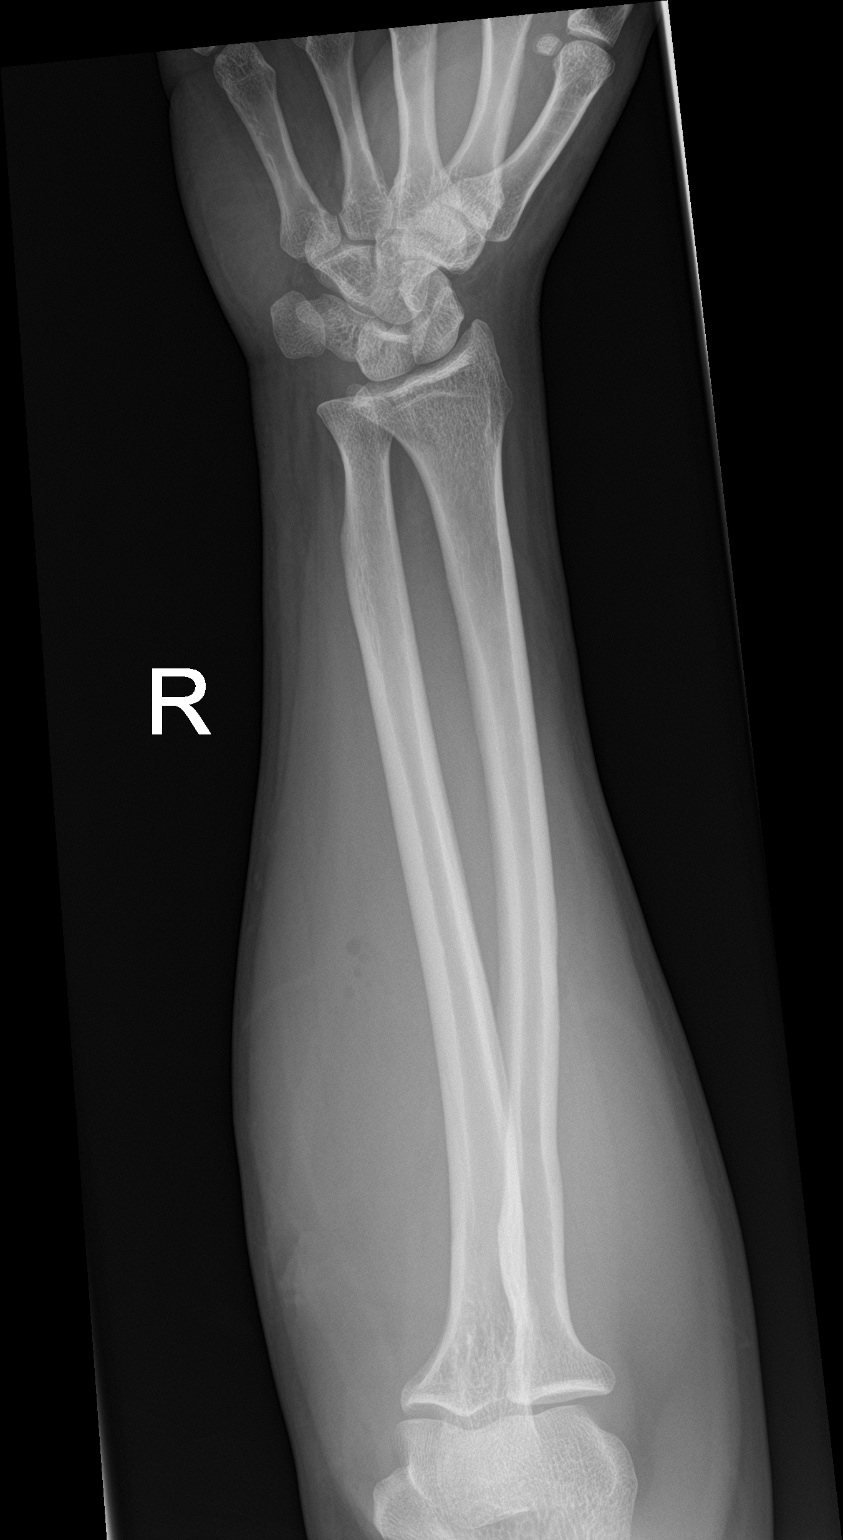

[forearm lat]
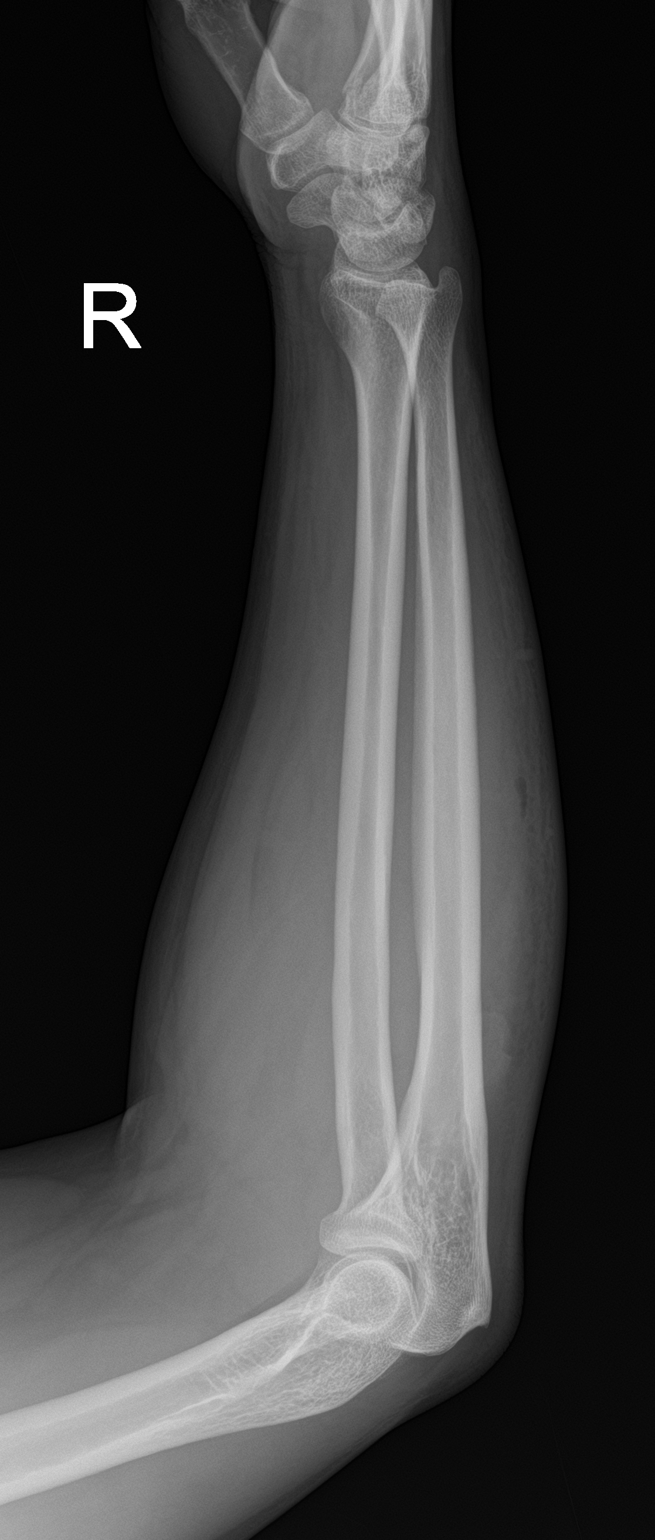

[2 of 2 positions shown; findings below may reference images not displayed]

FINDINGS: Locules of gas within the soft tissues of the forearm posteriorly.
No radiopaque foreign body. No acute bony abnormality. Specifically,
no fracture, subluxation, or dislocation.
IMPRESSION: No fracture or foreign body.

## 2023-09-07 ENCOUNTER — Ambulatory Visit (HOSPITAL_BASED_OUTPATIENT_CLINIC_OR_DEPARTMENT_OTHER): Payer: Self-pay | Admitting: Family Medicine

## 2023-09-07 ENCOUNTER — Encounter (HOSPITAL_BASED_OUTPATIENT_CLINIC_OR_DEPARTMENT_OTHER): Payer: Self-pay | Admitting: Family Medicine

## 2023-09-07 DIAGNOSIS — R7401 Elevation of levels of liver transaminase levels: Secondary | ICD-10-CM | POA: Insufficient documentation

## 2023-09-07 DIAGNOSIS — R209 Unspecified disturbances of skin sensation: Secondary | ICD-10-CM

## 2023-09-07 DIAGNOSIS — Z0000 Encounter for general adult medical examination without abnormal findings: Secondary | ICD-10-CM

## 2023-09-07 NOTE — Progress Notes (Signed)
    Procedures performed today:    None.  Independent interpretation of notes and tests performed by another provider:   None.  Brief History, Exam, Impression, and Recommendations:    BP 119/82 (BP Location: Left Arm, Patient Position: Sitting, Cuff Size: Normal)   Pulse 65   Ht 5\' 2"  (1.575 m)   Wt 165 lb 3.2 oz (74.9 kg)   SpO2 100%   BMI 30.22 kg/m   Wellness examination -     CBC with Differential/Platelet; Future -     Comprehensive metabolic panel; Future -     Hemoglobin A1c; Future -     Lipid panel; Future -     TSH Rfx on Abnormal to Free T4; Future  Transaminitis Assessment & Plan: Mild elevations in the past, has made notable lifestyle changes, was regularly consuming alcohol in the past and has stopped now. We discussed options and he would like to proceed with [place] of RUQ for further evaluation. Order placed today.  Orders: -     US ABDOMEN LIMITED RUQ (LIVER/GB); Future  Cold feet Assessment & Plan: Patient continues to have issues with cold feet.  Primarily notices issues at night, however also occurs during the day.  He has been making lifestyle modifications including regular physical activity.  He has also stopped drinking alcohol.  Despite this, continues to have issues with cold feet.  His primary concerns related to circulatory issues or if there is association with possible underlying liver disease.  He has not noticed any skin discoloration, no associated pain in feet or toes On exam, patient is in no acute distress, vital signs stable.  Bilateral feet are slightly cool to the touch, normal PT and DP pulses bilaterally.  No overlying skin changes.  Very small break in the skin between third and fourth toes of left foot.  No erythema or evidence of infection. Discussed overall benign evaluation.  Reviewed prior labs which are also reassuring.  Ultimately, no significant abnormality or complication observed with symptomatic cold feet.  Given this, do not  feel strongly that additional testing or workup would be required.  Ultimately, could consider vascular evaluation/ABI, but doubt abnormality would be observed.  Patient amenable to monitoring for now   Return in about 9 months (around 06/08/2024) for CPE with fasting labs 1 week prior.   ___________________________________________ Brandon Kushnir de [place], MD, ABFM, CAQSM Primary Care and Sports Medicine [facility]

## 2023-09-07 NOTE — Patient Instructions (Signed)
  Medication Instructions:  Your physician recommends that you continue on your current medications as directed. Please refer to the Current Medication list given to you today. --If you need a refill on any your medications before your next appointment, please call your pharmacy first. If no refills are authorized on file call the office.-- Lab Work: Your physician has recommended that you have lab work today: 1 week before next visit  If you have labs (blood work) drawn today and your tests are completely normal, you will receive your results via MyChart message OR a phone call from our staff.  Please ensure you check your voicemail in the event that you authorized detailed messages to be left on a delegated number. If you have any lab test that is abnormal or we need to change your treatment, we will call you to review the results.  Referrals/Procedures/Imaging: Ultrasound [place] [facility]  Follow-Up: Your next appointment:   Your physician recommends that you schedule a follow-up appointment in: 9 months physical  with Dr. de Peru  You will receive a text message or e-mail with a link to a survey about your care and experience with [place] today! We would greatly appreciate your feedback!   Thanks for letting us be apart of your health journey!!  Primary Care and Sports Medicine   Dr. Ceasar Mons [place]   We encourage you to activate your patient portal called "MyChart".  Sign up information is provided on this After Visit Summary.  MyChart is used to connect with patients for Virtual Visits (Telemedicine).  Patients are able to view lab/test results, encounter notes, upcoming appointments, etc.  Non-urgent messages can be sent to your provider as well. To learn more about what you can do with MyChart, please visit --  [email or web address].

## 2023-09-07 NOTE — Assessment & Plan Note (Signed)
 Mild elevations in the past, has made notable lifestyle changes, was regularly consuming alcohol in the past and has stopped now. We discussed options and he would like to proceed with [place] of [facility] for further evaluation. Order placed today.

## 2023-09-07 NOTE — Assessment & Plan Note (Signed)
 Patient continues to have issues with cold feet.  Primarily notices issues at night, however also occurs during the day.  He has been making lifestyle modifications including regular physical activity.  He has also stopped drinking alcohol.  Despite this, continues to have issues with cold feet.  His primary concerns related to circulatory issues or if there is association with possible underlying liver disease.  He has not noticed any skin discoloration, no associated pain in feet or toes On exam, patient is in no acute distress, vital signs stable.  Bilateral feet are slightly cool to the touch, normal PT and DP pulses bilaterally.  No overlying skin changes.  Very small break in the skin between third and fourth toes of left foot.  No erythema or evidence of infection. Discussed overall benign evaluation.  Reviewed prior labs which are also reassuring.  Ultimately, no significant abnormality or complication observed with symptomatic cold feet.  Given this, do not feel strongly that additional testing or workup would be required.  Ultimately, could consider vascular evaluation/ABI, but doubt abnormality would be observed.  Patient amenable to monitoring for now

## 2023-09-14 ENCOUNTER — Ambulatory Visit
Admission: RE | Admit: 2023-09-14 | Discharge: 2023-09-14 | Disposition: A | Discharge status: Home / Self Care | Payer: Self-pay | Source: Ambulatory Visit | Attending: Family Medicine | Admitting: Family Medicine

## 2023-09-14 DIAGNOSIS — R7401 Elevation of levels of liver transaminase levels: Secondary | ICD-10-CM

## 2023-11-08 ENCOUNTER — Telehealth (HOSPITAL_COMMUNITY): Payer: Self-pay

## 2023-11-08 ENCOUNTER — Encounter (HOSPITAL_COMMUNITY): Payer: Self-pay

## 2023-11-08 ENCOUNTER — Other Ambulatory Visit: Payer: Self-pay

## 2023-11-08 ENCOUNTER — Other Ambulatory Visit (HOSPITAL_COMMUNITY): Payer: Self-pay

## 2023-11-08 ENCOUNTER — Ambulatory Visit (HOSPITAL_COMMUNITY)
Admission: EM | Admit: 2023-11-08 | Discharge: 2023-11-08 | Disposition: A | Discharge status: Home / Self Care | Payer: Self-pay | Attending: Emergency Medicine | Admitting: Emergency Medicine

## 2023-11-08 DIAGNOSIS — H66011 Acute suppurative otitis media with spontaneous rupture of ear drum, right ear: Secondary | ICD-10-CM

## 2023-11-08 MED ORDER — AMOXICILLIN-POT CLAVULANATE 875-125 MG PO TABS: 1.0000 | ORAL_TABLET | Freq: Two times a day (BID) | ORAL | 0 refills | Status: DC

## 2023-11-08 MED ORDER — AMOXICILLIN-POT CLAVULANATE 875-125 MG PO TABS
1.0000 | ORAL_TABLET | Freq: Two times a day (BID) | ORAL | 0 refills | Status: AC
  Filled 2023-11-08: qty 14, 7d supply, fill #0

## 2023-11-08 NOTE — Telephone Encounter (Signed)
 Pt requesting medication be sent to a different pharmacy

## 2023-11-08 NOTE — ED Provider Notes (Signed)
 [facility]    CSN: [id number] Arrival date & time: 11/08/23  1421      History   Chief Complaint Chief Complaint  Patient presents with   Ear Pain    HPI Brandon Irwin is a 43 y.o. male.   Patient presents with recurrent right ear pain.  Patient has been seen here a few times for the same before and given ofloxacin  eardrops which helped some but the pain continues to come back.  Patient is requesting information for an ear nose and throat follow-up to determine why he continues to have issues with his right ear.  Denies hearing changes, congestion, fever, and headache.  Denies taking anything for his symptoms.  The history is provided by the patient and medical records.    Past Medical History:  Diagnosis Date   High cholesterol    Hypertension     Patient Active Problem List   Diagnosis Date Noted   Transaminitis 09/07/2023   Cold feet 06/09/2023   Recent weight loss 08/16/2022   History of dizziness 08/16/2022   Wellness examination 06/02/2022   Prediabetes 06/02/2022   Hyperlipidemia 06/02/2022   Asthma 04/14/2022   Low back pain 04/14/2022    History reviewed. No pertinent surgical history.     Home Medications    Prior to Admission medications   Medication Sig Start Date End Date Taking? Authorizing Provider  amoxicillin -clavulanate (AUGMENTIN ) 875-125 MG tablet Take 1 tablet by mouth every 12 (twelve) hours for 7 days. 11/08/23 11/15/23  Ann Keto, MD    Family History Family History  Problem Relation Age of Onset   Cancer Neg Hx    Diabetes Neg Hx    Heart failure Neg Hx     Social History Social History   Tobacco Use   Smoking status: Never    Passive exposure: Never   Smokeless tobacco: Never  Vaping Use   Vaping status: Never Used  Substance Use Topics   Alcohol use: Not Currently   Drug use: No     Allergies   Patient has no known allergies.   Review of Systems Review of Systems  Per HPI  Physical  Exam Triage Vital Signs ED Triage Vitals  Encounter Vitals Group     BP 11/08/23 1459 105/72     Systolic BP Percentile --      Diastolic BP Percentile --      Pulse Rate 11/08/23 1459 (!) 59     Resp 11/08/23 1459 18     Temp 11/08/23 1459 98 F (36.7 C)     Temp Source 11/08/23 1459 Oral     SpO2 11/08/23 1459 97 %     Weight --      Height --      Head Circumference --      Peak Flow --      Pain Score 11/08/23 1456 7     Pain Loc --      Pain Education --      Exclude from Growth Chart --    No data found.  Updated Vital Signs BP 105/72 (BP Location: Left Arm)   Pulse (!) 59   Temp 98 F (36.7 C) (Oral)   Resp 18   SpO2 97%   Visual Acuity Right Eye Distance:   Left Eye Distance:   Bilateral Distance:    Right Eye Near:   Left Eye Near:    Bilateral Near:     Physical Exam Vitals and nursing note  reviewed.  Constitutional:      General: He is awake. He is not in acute distress.    Appearance: Normal appearance. He is well-developed and well-groomed. He is not ill-appearing.  HENT:     Right Ear: Drainage present. Tympanic membrane is perforated.     Left Ear: Tympanic membrane, ear canal and external ear normal.  Skin:    General: Skin is warm and dry.  Neurological:     Mental Status: He is alert.  Psychiatric:        Behavior: Behavior is cooperative.      UC Treatments / Results  Labs (all labs ordered are listed, but only abnormal results are displayed) Labs Reviewed - No data to display  EKG   Radiology No results found.  Procedures Procedures (including critical care time)  Medications Ordered in UC Medications - No data to display  Initial Impression / Assessment and Plan / UC Course  I have reviewed the triage vital signs and the nursing notes.  Pertinent labs & imaging results that were available during my care of the patient were reviewed by me and considered in my medical decision making (see chart for details).      Patient is well-appearing.  Vitals are stable.  Upon assessment right TM is perforated and there is drainage present to right ear canal.  Prescribed Augmentin  for otitis media with perforation.  Given ENT follow-up.  Recommend alternating between Tylenol  and ibuprofen  as needed for pain.  Discussed return precautions. Final Clinical Impressions(s) / UC Diagnoses   Final diagnoses:  Non-recurrent acute suppurative otitis media of right ear with spontaneous rupture of tympanic membrane     Discharge Instructions      Start taking Augmentin  twice daily for 7 days for your infection. I have attached Dr. Darlin Ehrlich who is an ear nose and throat doctor that you can follow-up with regarding recurrent issues with your ear. Alternate between 650 mg of Tylenol  and 400 mg of ibuprofen  every 6-8 hours as needed for pain. Return here as needed.   ED Prescriptions     Medication Sig Dispense Auth. Provider   amoxicillin -clavulanate (AUGMENTIN ) 875-125 MG tablet Take 1 tablet by mouth every 12 (twelve) hours. 14 tablet Levora Reas A, NP      PDMP not reviewed this encounter.   Levora Reas A, NP 11/08/23 [phone]

## 2023-11-08 NOTE — Discharge Instructions (Signed)
 Start taking Augmentin  twice daily for 7 days for your infection. I have attached Dr. Darlin Ehrlich who is an ear nose and throat doctor that you can follow-up with regarding recurrent issues with your ear. Alternate between 650 mg of Tylenol  and 400 mg of ibuprofen  every 6-8 hours as needed for pain. Return here as needed.

## 2023-11-08 NOTE — ED Triage Notes (Signed)
 Pt has come here 3-4 times for his ear pain. Pt c/o RT ear pain. Pt was given ofloxacin  and said it helped some but it is still hurting.

## 2024-01-19 ENCOUNTER — Institutional Professional Consult (permissible substitution) (INDEPENDENT_AMBULATORY_CARE_PROVIDER_SITE_OTHER): Payer: Self-pay | Admitting: Otolaryngology

## 2024-01-19 ENCOUNTER — Ambulatory Visit (INDEPENDENT_AMBULATORY_CARE_PROVIDER_SITE_OTHER): Payer: Self-pay | Admitting: Audiology

## 2024-02-10 ENCOUNTER — Ambulatory Visit: Payer: Self-pay

## 2024-02-10 NOTE — Telephone Encounter (Signed)
 Patient advised Urgent Care or Emergency Room for his symptoms due to no availability in PCP office. He states he will go be seen.   FYI Only or Action Required?: FYI only for provider.  Patient was last seen in primary care on 09/07/2023 by de [place], Quintin PARAS, MD.  Called Nurse Triage reporting Flank Pain.  Symptoms began about a week ago.  Interventions attempted: Nothing.  Symptoms are: unchanged.  Triage Disposition: See Physician Within 24 Hours  Patient/caregiver understands and will follow disposition?: Yes--Patient advised Urgent Care or Emergency Room due to no availability at PCP office.  Patient is agreeable with this plan and states he will go be seen.             Spanish Interpreter ID # [id number]     Copied from CRM [id number]. Topic: Clinical - Red Word Triage >> Feb 10, 2024 12:30 PM Emylou G wrote: [facility] that prompted transfer to Nurse Triage: Yellow urine and foam.. pain on leftside kidney off and on Reason for Disposition  Urinating more frequently than usual (i.e., frequency) OR new-onset of the feeling of an urgent need to urinate (i.e., urgency)  Answer Assessment - Initial Assessment Questions In the morning--urine is very yellow and foamy Worried about kidneys Off and on kidney pain on left side of waist per patient Patient denies any pain during the time of Triage Patient states he just wants to get checked out to make sure that nothing is wrong.  He is also experiencing urinary frequency for a week.  Patient is advised that the recommendation with his symptoms is that he gets seen and evaluated in the next 24 hours. There is no availability in the PCP office. Patient is advised Urgent Care or the Emergency Room. Patient is advised that he can give us  a call back at anytime with any questions or concerns. Patient states that he will go to the hospital to be evaluated.   Spanish Interpreter ID # [id number]        1. SYMPTOM: What's the  main symptom you're concerned about? (e.g., frequency, incontinence)     Dark yellow urine, foamy urine---both in the mornings 2. ONSET: When did the  foamy urine/ dark yellow urine in the mornings/frequency  start?     A week ago 3. PAIN: Is there any pain? If Yes, ask: How bad is it? (Scale: 1-10; mild, moderate, severe)     I'm good right now. It's just in the mornings 4. CAUSE: What do you think is causing the symptoms?     unsure 5. OTHER SYMPTOMS: Do you have any other symptoms? (e.g., blood in urine, fever, flank pain, pain with urination)     Dark yellow urine, foamy urine,frequency---all x 1 week  Protocols used: Urinary Symptoms-A-AH

## 2024-05-28 ENCOUNTER — Other Ambulatory Visit (HOSPITAL_BASED_OUTPATIENT_CLINIC_OR_DEPARTMENT_OTHER): Payer: Self-pay

## 2024-05-29 LAB — CBC WITH DIFFERENTIAL/PLATELET
Basophils Absolute: 0.1 x10E3/uL (ref 0.0–0.2)
Basos: 1 %
EOS (ABSOLUTE): 1.1 x10E3/uL — ABNORMAL HIGH (ref 0.0–0.4)
Eos: 15 %
Hematocrit: 43.5 % (ref 37.5–51.0)
Hemoglobin: 14.2 g/dL (ref 13.0–17.7)
Immature Grans (Abs): 0 x10E3/uL (ref 0.0–0.1)
Immature Granulocytes: 0 %
Lymphocytes Absolute: 3.2 x10E3/uL — ABNORMAL HIGH (ref 0.7–3.1)
Lymphs: 44 %
MCH: 29 pg (ref 26.6–33.0)
MCHC: 32.6 g/dL (ref 31.5–35.7)
MCV: 89 fL (ref 79–97)
Monocytes Absolute: 0.7 x10E3/uL (ref 0.1–0.9)
Monocytes: 9 %
Neutrophils Absolute: 2.2 x10E3/uL (ref 1.4–7.0)
Neutrophils: 31 %
Platelets: 346 x10E3/uL (ref 150–450)
RBC: 4.9 x10E6/uL (ref 4.14–5.80)
RDW: 13.6 % (ref 11.6–15.4)
WBC: 7.2 x10E3/uL (ref 3.4–10.8)

## 2024-05-29 LAB — COMPREHENSIVE METABOLIC PANEL WITH GFR
ALT: 46 IU/L — ABNORMAL HIGH (ref 0–44)
AST: 31 IU/L (ref 0–40)
Albumin: 4.4 g/dL (ref 4.1–5.1)
Alkaline Phosphatase: 78 IU/L (ref 47–123)
BUN/Creatinine Ratio: 24 — ABNORMAL HIGH (ref 9–20)
BUN: 19 mg/dL (ref 6–24)
Bilirubin Total: 0.4 mg/dL (ref 0.0–1.2)
CO2: 23 mmol/L (ref 20–29)
Calcium: 9.6 mg/dL (ref 8.7–10.2)
Chloride: 100 mmol/L (ref 96–106)
Creatinine, Ser: 0.79 mg/dL (ref 0.76–1.27)
Globulin, Total: 3 g/dL (ref 1.5–4.5)
Glucose: 99 mg/dL (ref 70–99)
Potassium: 4.5 mmol/L (ref 3.5–5.2)
Sodium: 135 mmol/L (ref 134–144)
Total Protein: 7.4 g/dL (ref 6.0–8.5)
eGFR: 112 mL/min/1.73 (ref >59)

## 2024-05-29 LAB — LIPID PANEL
Chol/HDL Ratio: 4.2 ratio (ref 0.0–5.0)
Cholesterol, Total: 253 mg/dL — ABNORMAL HIGH (ref 100–199)
HDL: 60 mg/dL (ref >39)
LDL Chol Calc (NIH): 162 mg/dL — ABNORMAL HIGH (ref 0–99)
Triglycerides: 172 mg/dL — ABNORMAL HIGH (ref 0–149)
VLDL Cholesterol Cal: 31 mg/dL (ref 5–40)

## 2024-05-29 LAB — HEMOGLOBIN A1C
Est. average glucose Bld gHb Est-mCnc: 123 mg/dL
Hgb A1c MFr Bld: 5.9 % — ABNORMAL HIGH (ref 4.8–5.6)

## 2024-05-29 LAB — TSH RFX ON ABNORMAL TO FREE T4: TSH: 3.34 u[IU]/mL (ref 0.450–4.500)

## 2024-06-06 ENCOUNTER — Ambulatory Visit (HOSPITAL_BASED_OUTPATIENT_CLINIC_OR_DEPARTMENT_OTHER): Payer: Self-pay | Admitting: Family Medicine

## 2024-06-11 ENCOUNTER — Encounter (HOSPITAL_BASED_OUTPATIENT_CLINIC_OR_DEPARTMENT_OTHER): Payer: Self-pay | Admitting: Family Medicine

## 2024-06-11 ENCOUNTER — Ambulatory Visit (INDEPENDENT_AMBULATORY_CARE_PROVIDER_SITE_OTHER): Payer: Self-pay | Admitting: Family Medicine

## 2024-06-11 VITALS — BP 124/85 | HR 66 | Temp 97.7°F | Resp 18 | Ht 62.0 in | Wt 173.0 lb

## 2024-06-11 DIAGNOSIS — L309 Dermatitis, unspecified: Secondary | ICD-10-CM | POA: Insufficient documentation

## 2024-06-11 DIAGNOSIS — Z0000 Encounter for general adult medical examination without abnormal findings: Secondary | ICD-10-CM

## 2024-06-11 NOTE — Assessment & Plan Note (Addendum)
 Patient notes that he has been having rash over right hand primarily.  He notes that he has some peeling skin, mild itching, small breaks in the skin.  He thinks this has worsened or become more noticeable with colder weather.  He did go to the pharmacy and reports that they recommended utilizing different topical treatments including moisturizing ointment.  He also reports trying topical antibiotic ointment.  He thinks that the antibiotic ointment has been more helpful. Over right hand, patient does have some intermittent scaling, small cracks apparent within portions of area of hand affected.  No significant erythema.  No current drainage or discharge. Discussed that observed skin changes are more likely to be related to environmental factors resulting in hand dermatitis.  Do not suspect any ongoing superimposed bacterial infection.  I also do not think autoimmune etiology is responsible for observed changes.  We discussed options and we can proceed with utilizing alternative topical treatment.  Would recommend using cream as opposed to ointment for now and using more frequently during the day.  He previously was only using ointment once a day. We can also proceed with referral to dermatology at patient request.

## 2024-06-11 NOTE — Assessment & Plan Note (Addendum)

## 2024-06-11 NOTE — Progress Notes (Signed)
 Subjective:    CC: Annual Physical Exam  HPI: Brandon Irwin is a 43 y.o. presenting for annual physical  I reviewed the past medical history, family history, social history, surgical history, and allergies today and no changes were needed.  Please see the problem list section below in epic for further details.  Past Medical History: Past Medical History:  Diagnosis Date   High cholesterol    Hypertension    Past Surgical History: History reviewed. No pertinent surgical history. Social History: Social History   Socioeconomic History   Marital status: Significant Other    Spouse name: Not on file   Number of children: Not on file   Years of education: Not on file   Highest education level: Not on file  Occupational History   Not on file  Tobacco Use   Smoking status: Never    Passive exposure: Never   Smokeless tobacco: Never  Vaping Use   Vaping status: Never Used  Substance and Sexual Activity   Alcohol use: Not Currently   Drug use: No   Sexual activity: Yes  Other Topics Concern   Not on file  Social History Narrative   Not on file   Social Drivers of Health   Financial Resource Strain: Not on file  Food Insecurity: Not on file  Transportation Needs: Not on file  Physical Activity: Not on file  Stress: Not on file  Social Connections: Not on file   Family History: Family History  Problem Relation Age of Onset   Cancer Neg Hx    Diabetes Neg Hx    Heart failure Neg Hx    Allergies: No Known Allergies Medications: See med rec.  Review of Systems: No headache, visual changes, nausea, vomiting, diarrhea, constipation, dizziness, abdominal pain, skin rash, fevers, chills, night sweats, swollen lymph nodes, weight loss, chest pain, body aches, joint swelling, muscle aches, shortness of breath, mood changes, visual or auditory hallucinations.  Objective:    BP 124/85 (BP Location: Left Arm, Patient Position: Sitting)   Pulse 66   Temp 97.7 F (36.5  C) (Oral)   Resp 18   Ht 5' 2 (1.575 m)   Wt 173 lb (78.5 kg)   SpO2 98%   BMI 31.64 kg/m   General: Well Developed, well nourished, and in no acute distress.  Neuro: Alert and oriented x3, extra-ocular muscles intact, sensation grossly intact. Cranial nerves II through XII are intact, motor, sensory, and coordinative functions are all intact. HEENT: Normocephalic, atraumatic, pupils equal round reactive to light, neck supple, no masses, no lymphadenopathy, thyroid nonpalpable. Oropharynx, nasopharynx, external ear canals are unremarkable. Skin: Warm and dry, no rashes noted.  Cardiac: Regular rate and rhythm, no murmurs rubs or gallops.  Respiratory: Clear to auscultation bilaterally. Not using accessory muscles, speaking in full sentences.  Abdominal: Soft, nontender, nondistended, positive bowel sounds, no masses, no organomegaly. Musculoskeletal: Shoulder, elbow, wrist, hip, knee, ankle stable, and with full range of motion.  Impression and Recommendations:    Wellness examination Assessment & Plan: Routine HCM labs reviewed. HCM reviewed/discussed. Anticipatory guidance regarding healthy weight, lifestyle and choices given. Recommend healthy diet.  Recommend approximately 150 minutes/week of moderate intensity exercise Recommend regular dental and vision exams Always use seatbelt/lap and shoulder restraints Recommend using smoke alarms and checking batteries at least twice a year Recommend using sunscreen when outside Discussed immunization recommendations   Hand dermatitis Assessment & Plan: Patient notes that he has been having rash over right hand primarily.  He notes  that he has some peeling skin, mild itching, small breaks in the skin.  He thinks this has worsened or become more noticeable with colder weather.  He did go to the pharmacy and reports that they recommended utilizing different topical treatments including moisturizing ointment.  He also reports trying topical  antibiotic ointment.  He thinks that the antibiotic ointment has been more helpful. Over right hand, patient does have some intermittent scaling, small cracks apparent within portions of area of hand affected.  No significant erythema.  No current drainage or discharge. Discussed that observed skin changes are more likely to be related to environmental factors resulting in hand dermatitis.  Do not suspect any ongoing superimposed bacterial infection.  I also do not think autoimmune etiology is responsible for observed changes.  We discussed options and we can proceed with utilizing alternative topical treatment.  Would recommend using cream as opposed to ointment for now and using more frequently during the day.  He previously was only using ointment once a day. We can also proceed with referral to dermatology at patient request.  Orders: -     Ambulatory referral to Dermatology  Return in about 1 year (around 06/11/2025) for CPE, 40 minutes.   ___________________________________________ Haitham Dolinsky de [place], MD, ABFM, CAQSM Primary Care and Sports Medicine [facility]

## 2025-02-26 ENCOUNTER — Ambulatory Visit: Payer: Self-pay | Admitting: Physician Assistant

## 2025-06-12 ENCOUNTER — Encounter (HOSPITAL_BASED_OUTPATIENT_CLINIC_OR_DEPARTMENT_OTHER): Payer: Self-pay | Admitting: Family Medicine
